# Patient Record
Sex: Female | Born: 1965 | Hispanic: No | Marital: Single | Smoking: Never smoker
Health system: Southern US, Community
[De-identification: ages and names within clinical notes are randomized; demographics above are authoritative.]

## PROBLEM LIST (undated history)

## (undated) DIAGNOSIS — F32A Depression, unspecified: Secondary | ICD-10-CM

## (undated) DIAGNOSIS — E559 Vitamin D deficiency, unspecified: Secondary | ICD-10-CM

## (undated) DIAGNOSIS — D649 Anemia, unspecified: Secondary | ICD-10-CM

## (undated) DIAGNOSIS — F329 Major depressive disorder, single episode, unspecified: Secondary | ICD-10-CM

## (undated) DIAGNOSIS — E785 Hyperlipidemia, unspecified: Secondary | ICD-10-CM

## (undated) DIAGNOSIS — IMO0001 Reserved for inherently not codable concepts without codable children: Secondary | ICD-10-CM

## (undated) DIAGNOSIS — J8410 Pulmonary fibrosis, unspecified: Secondary | ICD-10-CM

## (undated) DIAGNOSIS — J309 Allergic rhinitis, unspecified: Secondary | ICD-10-CM

## (undated) DIAGNOSIS — Z87828 Personal history of other (healed) physical injury and trauma: Secondary | ICD-10-CM

## (undated) DIAGNOSIS — J984 Other disorders of lung: Secondary | ICD-10-CM

## (undated) DIAGNOSIS — J8489 Other specified interstitial pulmonary diseases: Secondary | ICD-10-CM

## (undated) HISTORY — DX: Hyperlipidemia, unspecified: E78.5

## (undated) HISTORY — DX: Pulmonary fibrosis, unspecified: J84.10

## (undated) HISTORY — DX: Allergic rhinitis, unspecified: J30.9

## (undated) HISTORY — DX: Other disorders of lung: J98.4

## (undated) HISTORY — DX: Anemia, unspecified: D64.9

## (undated) HISTORY — DX: Vitamin D deficiency, unspecified: E55.9

## (undated) HISTORY — DX: Major depressive disorder, single episode, unspecified: F32.9

## (undated) HISTORY — DX: Depression, unspecified: F32.A

---
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
## 1991-06-25 HISTORY — PX: TUBAL LIGATION: SHX77

## 2003-06-25 DIAGNOSIS — Z87828 Personal history of other (healed) physical injury and trauma: Secondary | ICD-10-CM

## 2003-06-25 HISTORY — DX: Personal history of other (healed) physical injury and trauma: Z87.828

## 2003-06-25 HISTORY — PX: KNEE SURGERY: SHX244

## 2004-05-09 ENCOUNTER — Ambulatory Visit: Payer: Self-pay | Admitting: Unknown Physician Specialty

## 2004-06-07 ENCOUNTER — Ambulatory Visit: Payer: Self-pay | Admitting: Unknown Physician Specialty

## 2008-03-30 ENCOUNTER — Ambulatory Visit: Payer: Self-pay | Admitting: Endocrinology

## 2008-04-05 ENCOUNTER — Ambulatory Visit: Payer: Self-pay | Admitting: Endocrinology

## 2008-09-22 DIAGNOSIS — F329 Major depressive disorder, single episode, unspecified: Secondary | ICD-10-CM

## 2008-09-22 DIAGNOSIS — E7800 Pure hypercholesterolemia, unspecified: Secondary | ICD-10-CM

## 2008-09-22 DIAGNOSIS — R059 Cough, unspecified: Secondary | ICD-10-CM | POA: Insufficient documentation

## 2008-09-22 DIAGNOSIS — D649 Anemia, unspecified: Secondary | ICD-10-CM | POA: Insufficient documentation

## 2008-09-22 DIAGNOSIS — L719 Rosacea, unspecified: Secondary | ICD-10-CM | POA: Insufficient documentation

## 2008-09-22 DIAGNOSIS — E039 Hypothyroidism, unspecified: Secondary | ICD-10-CM

## 2008-09-22 DIAGNOSIS — L708 Other acne: Secondary | ICD-10-CM

## 2008-09-22 DIAGNOSIS — N39498 Other specified urinary incontinence: Secondary | ICD-10-CM

## 2008-09-22 DIAGNOSIS — K219 Gastro-esophageal reflux disease without esophagitis: Secondary | ICD-10-CM

## 2008-09-22 DIAGNOSIS — R05 Cough: Secondary | ICD-10-CM

## 2008-09-26 ENCOUNTER — Ambulatory Visit: Payer: Self-pay | Admitting: Internal Medicine

## 2008-09-26 DIAGNOSIS — J8410 Pulmonary fibrosis, unspecified: Secondary | ICD-10-CM

## 2008-09-26 DIAGNOSIS — J82 Pulmonary eosinophilia, not elsewhere classified: Secondary | ICD-10-CM

## 2008-10-03 ENCOUNTER — Encounter: Payer: Self-pay | Admitting: Internal Medicine

## 2008-10-28 ENCOUNTER — Ambulatory Visit: Payer: Self-pay | Admitting: Internal Medicine

## 2008-11-20 IMAGING — CT CT CHEST W/ CM
1 of 2 series · 14 of 32 positions shown, 18 images · non-contrast
Comparison: none

REASON FOR EXAM: Shortness of breath  Infiltrates on xray
COMMENTS:

[Series 3: lung windows · axial · 0.75mm/px · z∈[-570,-350]mm · 14 of 53 slices shown, 18 images]
[im 5/53  mediastinal]
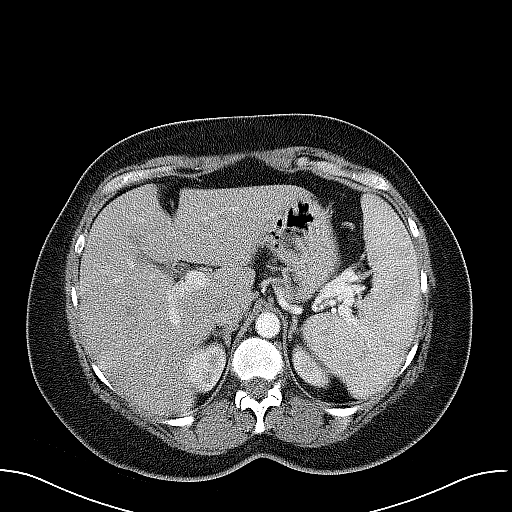
[im 5/53  lung]
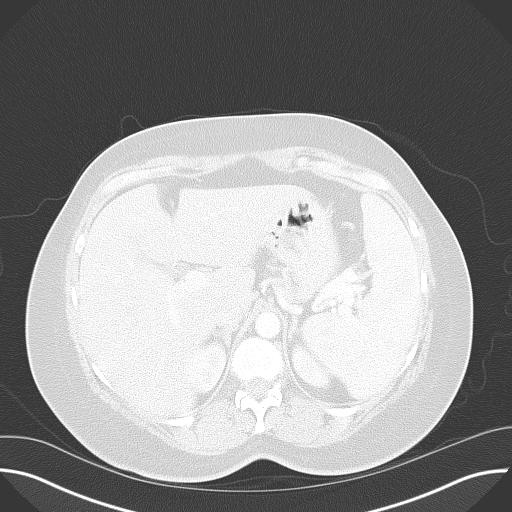
[im 9/53  lung]
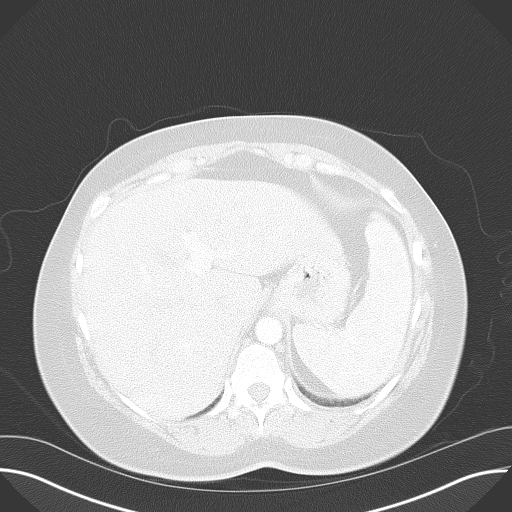
[im 13/53  lung]
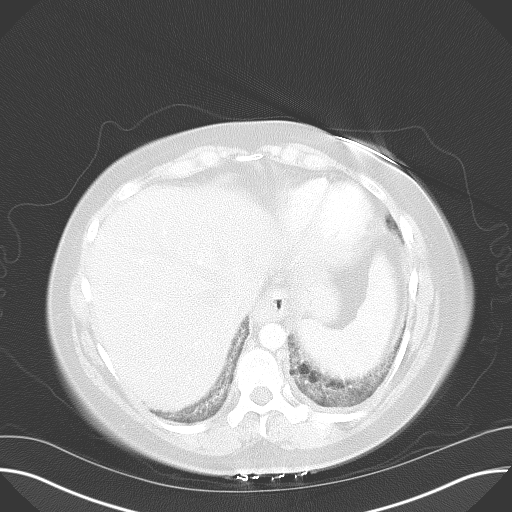
[im 17/53  lung]
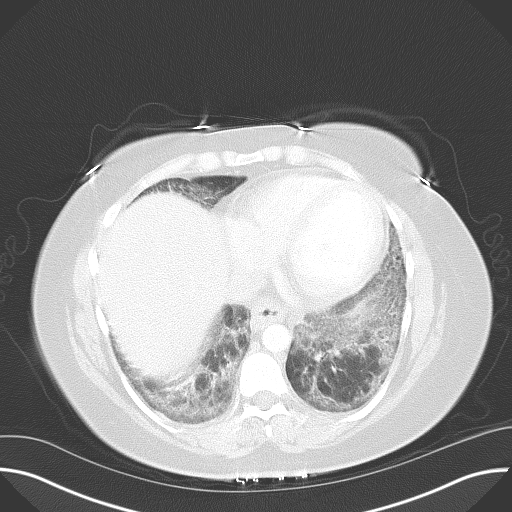
[im 21/53  mediastinal]
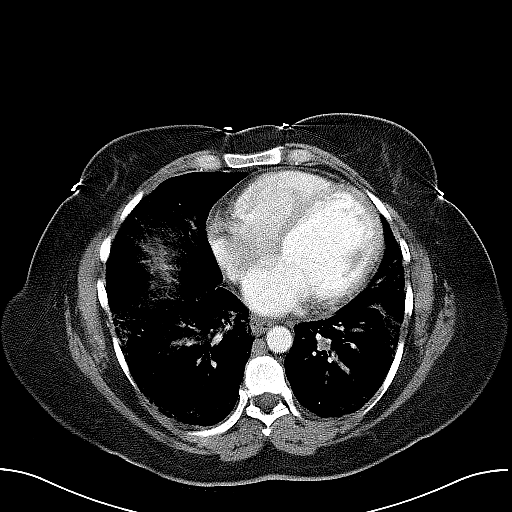
[im 21/53  lung]
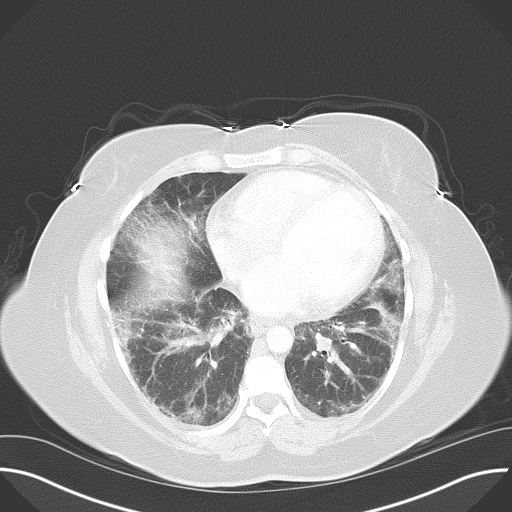
[im 24/53  lung]
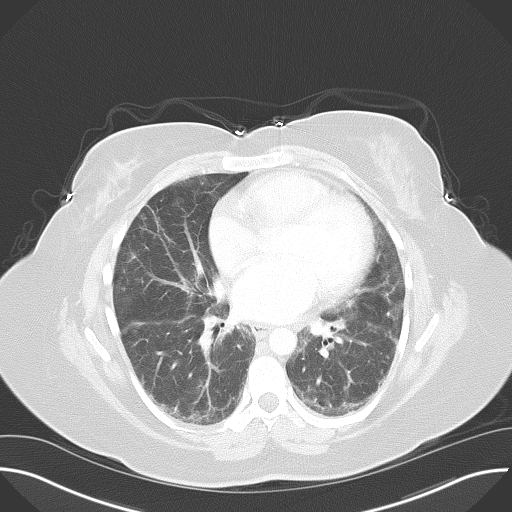
[im 25/53  lung]
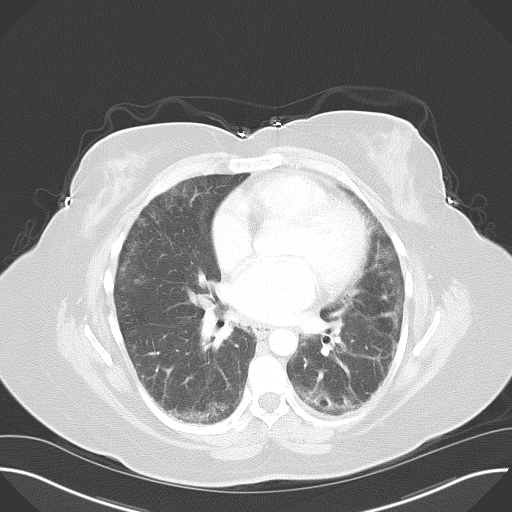
[im 27/53  lung]
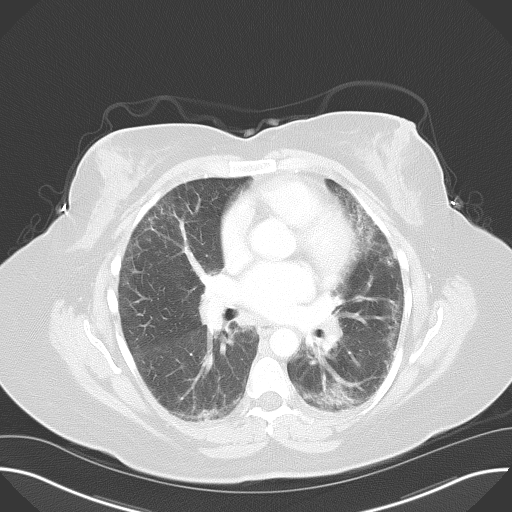
[im 29/53  mediastinal]
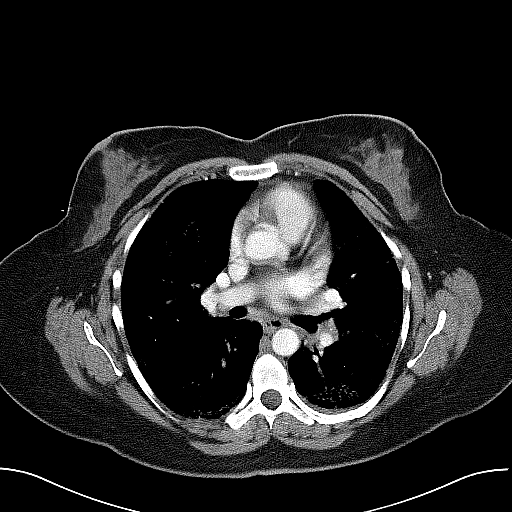
[im 29/53  lung]
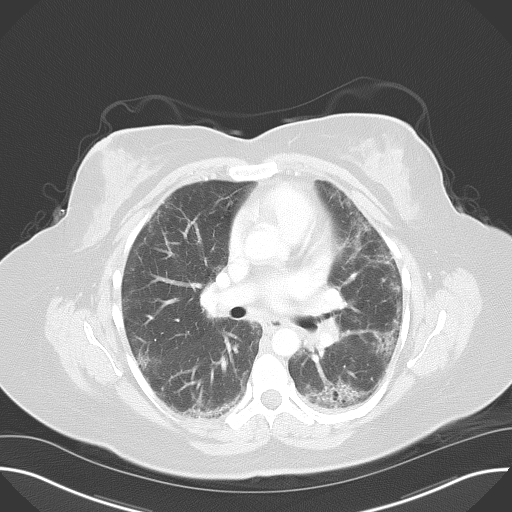
[im 33/53  lung]
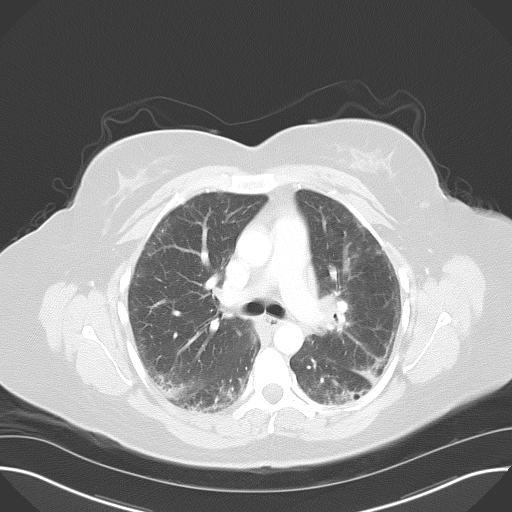
[im 37/53  lung]
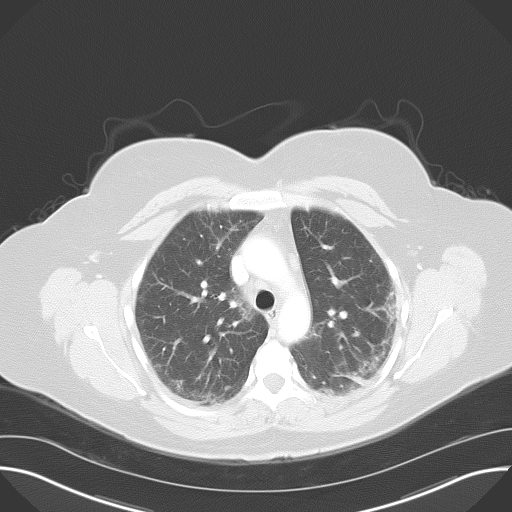
[im 41/53  lung]
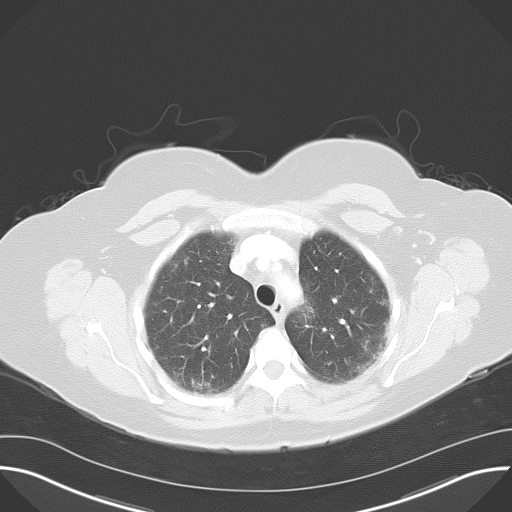
[im 45/53  mediastinal]
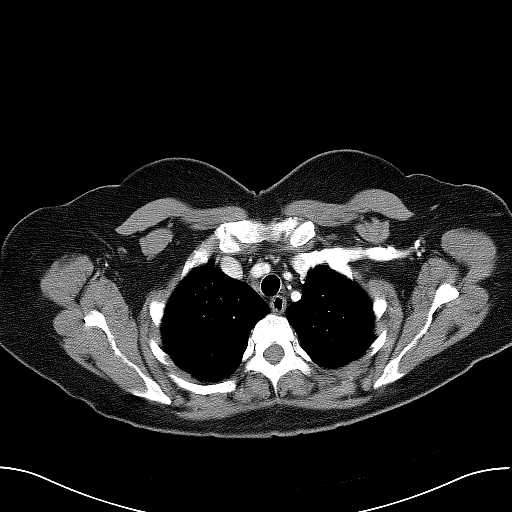
[im 45/53  lung]
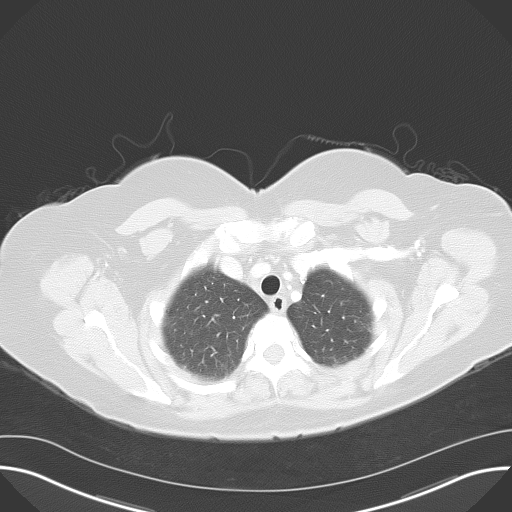
[im 49/53  lung]
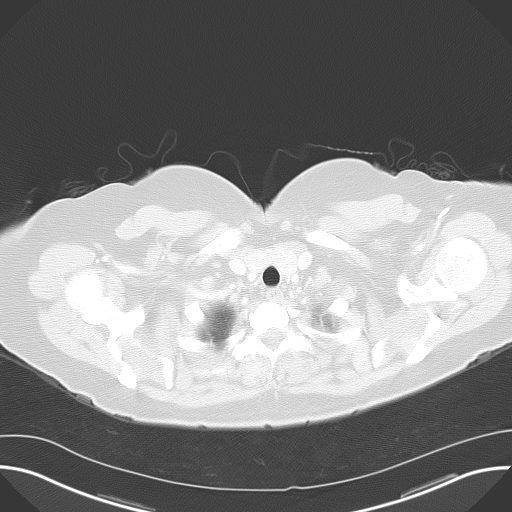

[14 of 32 positions shown; findings below may reference images not displayed]

PROCEDURE:     CT  - CT CHEST WITH CONTRAST  - April 05, 2008  [DATE]

RESULT:     Helical 5 mm sections were obtained from the thoracic inlet
through the lung bases status post intravenous administration of 70 ml of
Isovue 370.

Evaluation of the mediastinum and hilar regions and structures demonstrates
no evidence of mediastinal or hilar adenopathy nor masses. The lung
parenchyma demonstrates patchy areas of interstitial thickening along the
periphery of the lungs with greatest confluence in the lung bases. There
does appear to be an airspace component of these areas of interstitial
thickening. Mild honeycombing is appreciated within the lung bases and these
findings appear to be consistent with a component of interstitial lung
disease. A superimposed airspace infiltrate cannot be excluded. No focal
regions of consolidation are appreciated nor evidence of masses or nodules.
The visualized upper abdominal viscera demonstrate a rounded area of soft
tissue attenuation within the LEFT epigastric region. This demonstrates a
central area of fat and likely represents multiple loops of small bowel. If
there is clinical concern of abdominal abnormality this can be further
evaluated with CT of the abdomen and pelvis. The remaining visualized upper
abdominal viscera are otherwise unremarkable.
IMPRESSION: 1. CT findings which appear to represent an element of interstitial lung
disease primarily within the periphery of the lung bases. A superimposed
airspace component is also a diagnostic consideration, if clinically
appropriate. No focal regions of consolidation or masses or nodules are
identified. These findings  can be monitored with CT status post appropriate
therapeutic regimen.

2. Likely a loop of bowel within the LEFT epigastric region as described
above.

## 2008-11-23 ENCOUNTER — Encounter: Payer: Self-pay | Admitting: Internal Medicine

## 2009-06-01 ENCOUNTER — Ambulatory Visit: Payer: Self-pay | Admitting: Family Medicine

## 2009-10-11 ENCOUNTER — Ambulatory Visit: Payer: Self-pay | Admitting: Cardiology

## 2009-12-04 DIAGNOSIS — J984 Other disorders of lung: Secondary | ICD-10-CM | POA: Insufficient documentation

## 2010-01-30 ENCOUNTER — Encounter: Payer: Self-pay | Admitting: Otolaryngology

## 2011-06-25 HISTORY — PX: LUNG BIOPSY: SHX232

## 2012-01-13 ENCOUNTER — Ambulatory Visit: Payer: Self-pay | Admitting: Family Medicine

## 2012-01-15 ENCOUNTER — Ambulatory Visit: Payer: Self-pay | Admitting: Family Medicine

## 2012-08-11 ENCOUNTER — Encounter: Payer: Self-pay | Admitting: Internal Medicine

## 2012-08-22 ENCOUNTER — Encounter: Payer: Self-pay | Admitting: Internal Medicine

## 2012-08-31 IMAGING — US ULTRASOUND RIGHT BREAST
1 series · 14 of 25 positions shown · non-contrast
Comparison: none

REASON FOR EXAM: Rt Breast asymmetric density
COMMENTS:

PROCEDURE:     US  - US BREAST RIGHT  - January 15, 2012  [DATE]
RESULT:
The region of interest within the right breast is evaluated from the 9
o'clock to the 12 o'clock position. No solid or cystic sonographic
abnormalities are identified.

[Series 1: ultrasound right breast · 0.10mm/px · 14 of 29 slices shown]
[im 1/29]
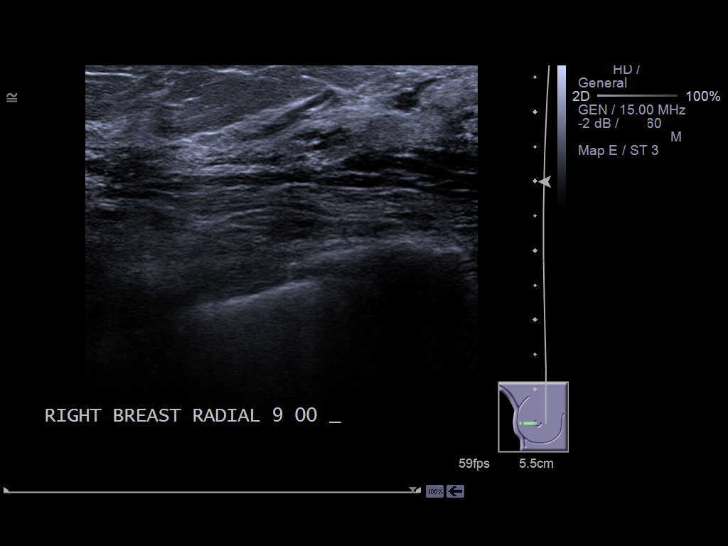
[im 3/29]
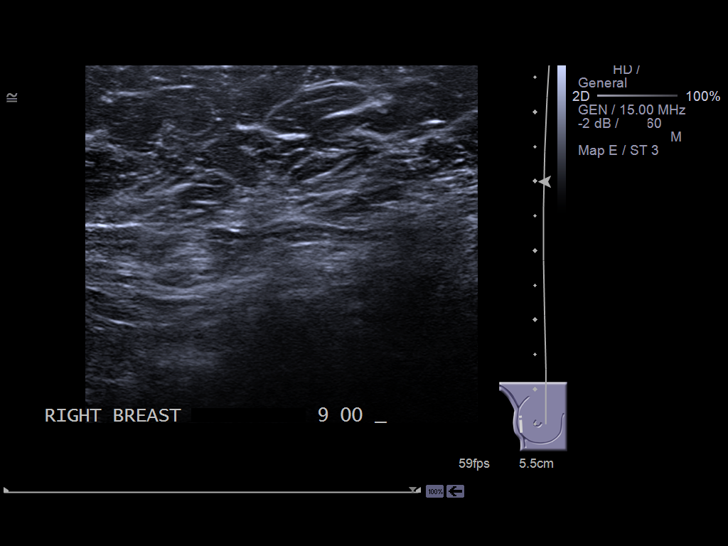
[im 5/29]
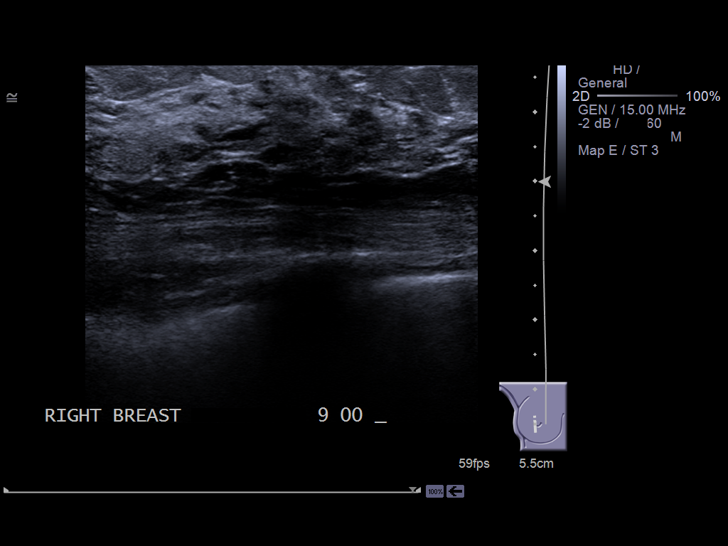
[im 8/29]
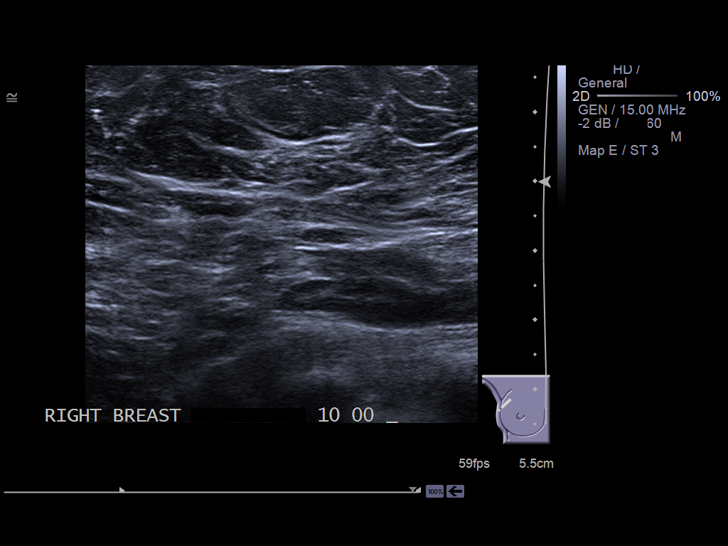
[im 10/29]
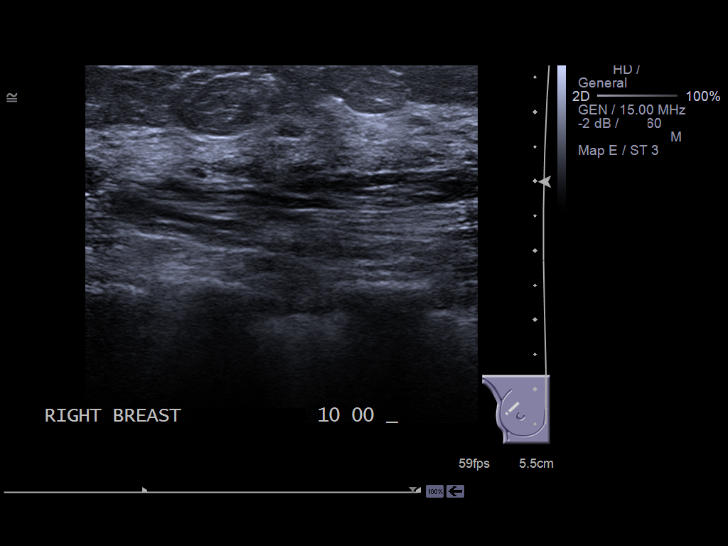
[im 11/29]
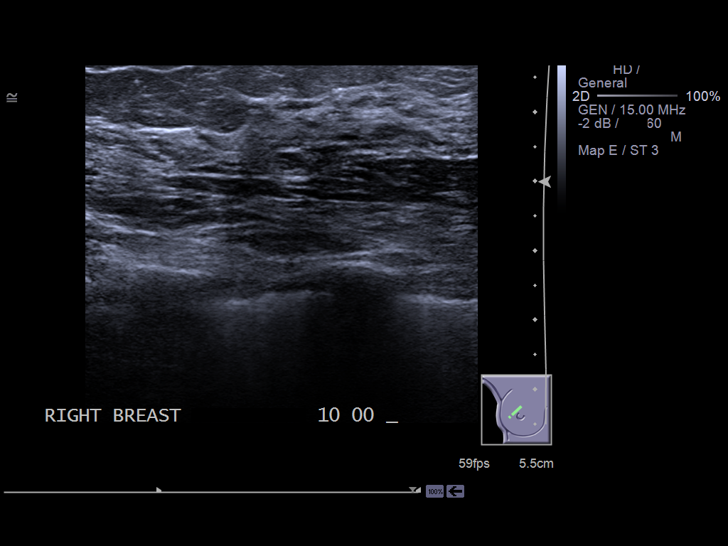
[im 13/29]
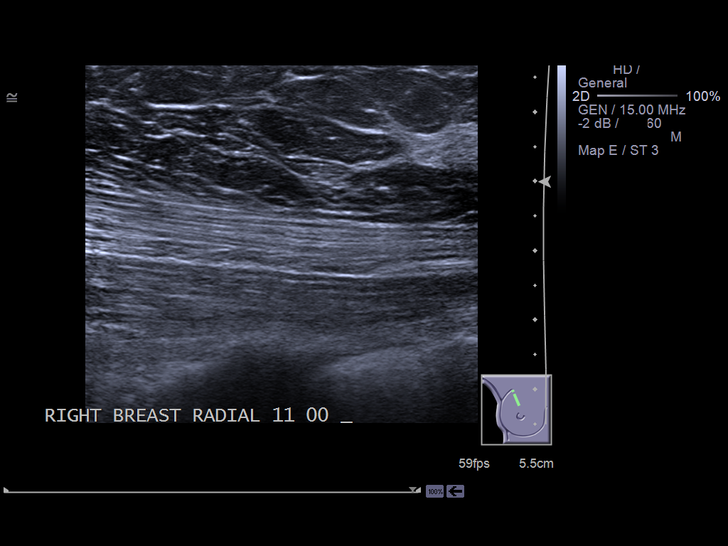
[im 16/29]
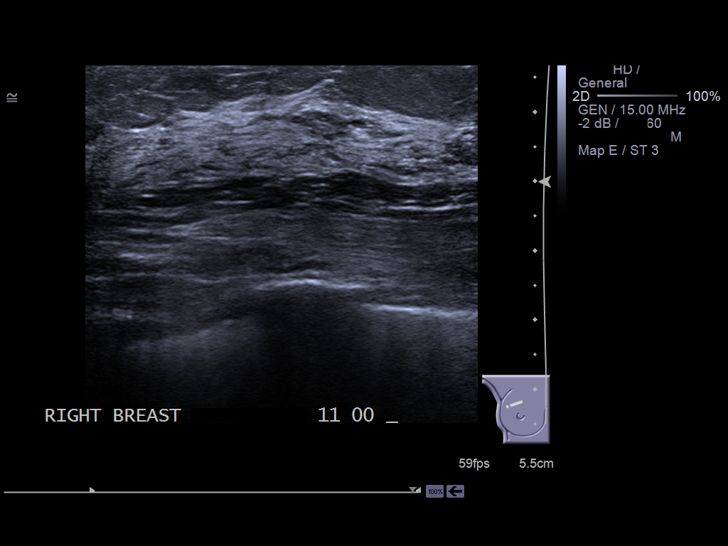
[im 18/29]
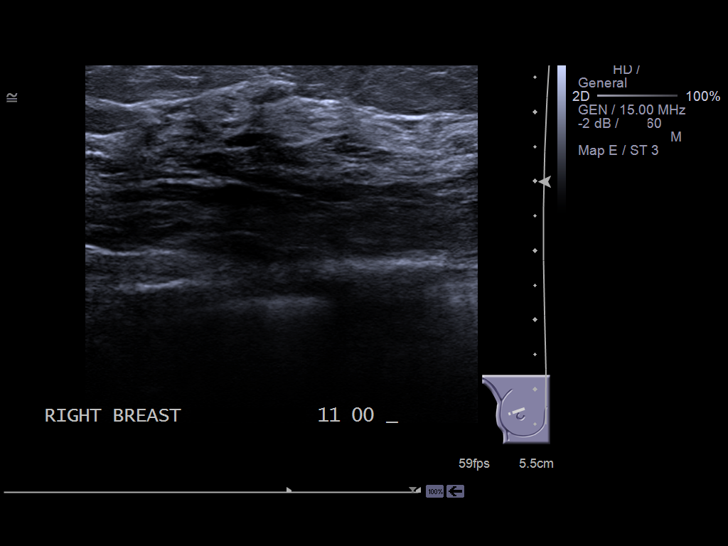
[im 19/29]
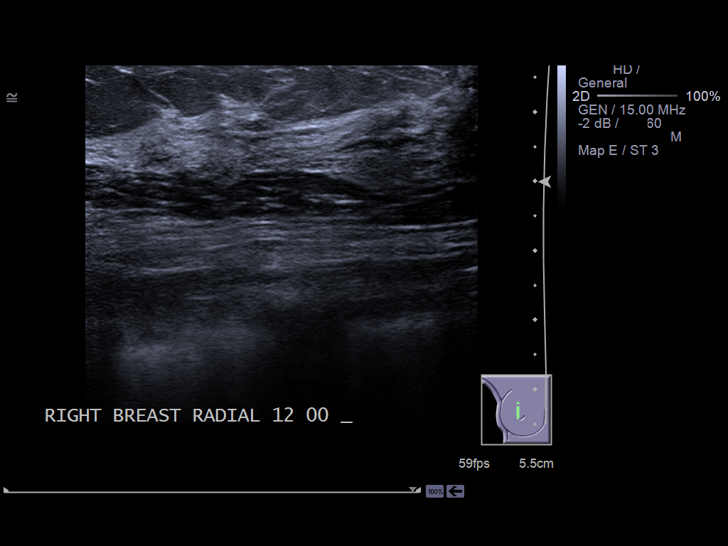
[im 22/29]
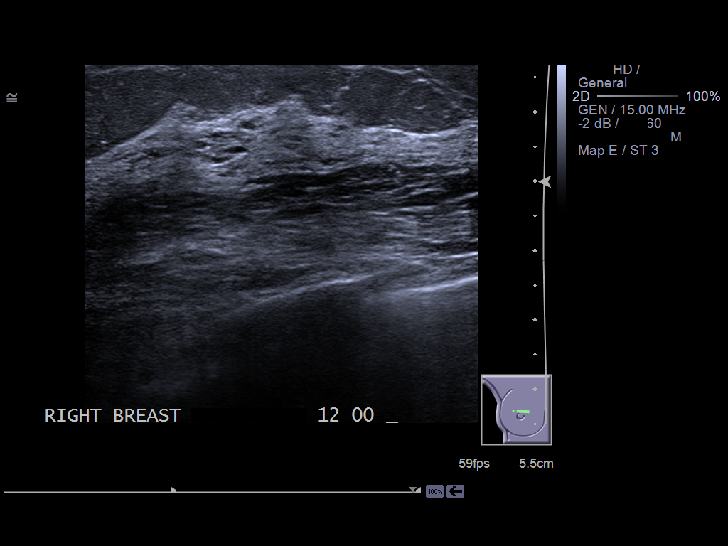
[im 24/29]
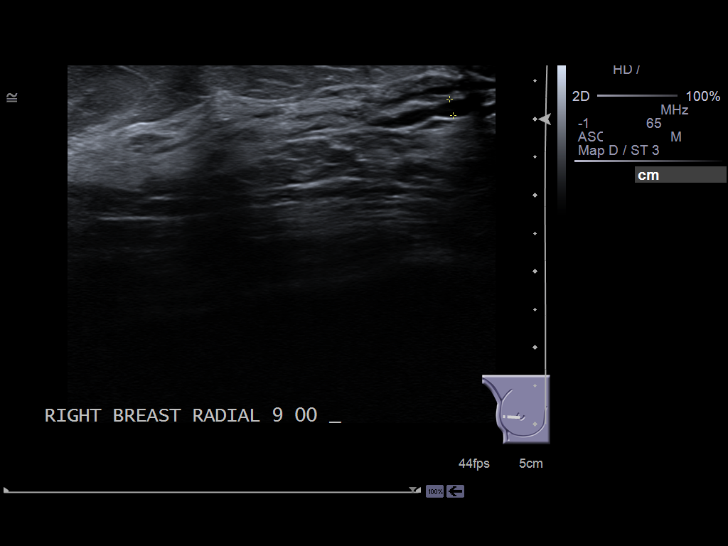
[im 26/29]
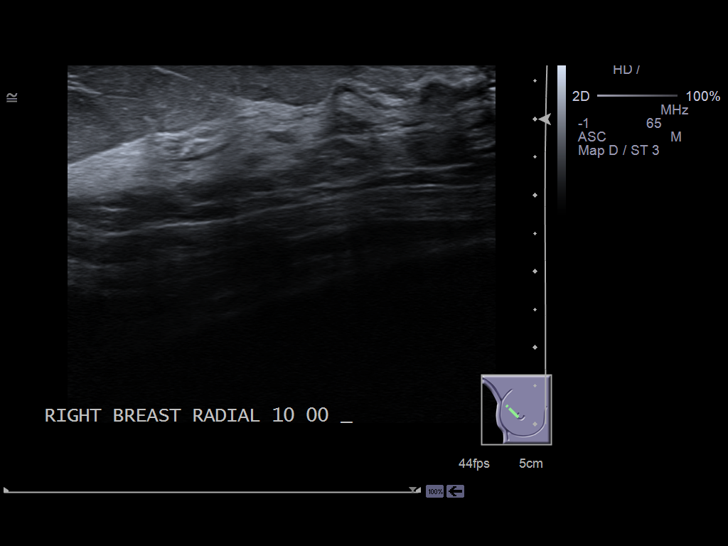
[im 29/29]
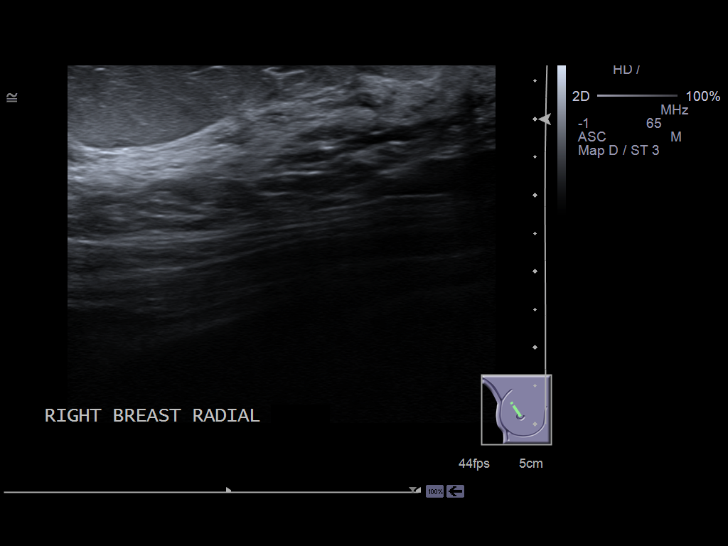

[14 of 25 positions shown; findings below may reference images not displayed]

IMPRESSION: Unremarkable Focused Right Breast Ultrasound. Please refer
to the additional radiographic view dictation for a completed discussion.

## 2012-09-22 ENCOUNTER — Encounter: Payer: Self-pay | Admitting: Internal Medicine

## 2012-10-22 ENCOUNTER — Encounter: Payer: Self-pay | Admitting: Internal Medicine

## 2012-11-22 ENCOUNTER — Encounter: Payer: Self-pay | Admitting: Internal Medicine

## 2013-02-01 DIAGNOSIS — J8489 Other specified interstitial pulmonary diseases: Secondary | ICD-10-CM | POA: Insufficient documentation

## 2014-11-01 ENCOUNTER — Other Ambulatory Visit: Payer: Self-pay

## 2014-11-01 DIAGNOSIS — Z1231 Encounter for screening mammogram for malignant neoplasm of breast: Secondary | ICD-10-CM

## 2014-11-15 ENCOUNTER — Other Ambulatory Visit: Payer: Self-pay | Admitting: Internal Medicine

## 2014-11-15 DIAGNOSIS — J84112 Idiopathic pulmonary fibrosis: Secondary | ICD-10-CM

## 2014-11-17 ENCOUNTER — Ambulatory Visit
Admission: RE | Admit: 2014-11-17 | Discharge: 2014-11-17 | Disposition: A | Discharge status: Home / Self Care | Payer: 59 | Source: Ambulatory Visit | Attending: Family Medicine | Admitting: Family Medicine

## 2014-11-17 ENCOUNTER — Other Ambulatory Visit: Payer: Self-pay | Admitting: Family Medicine

## 2014-11-17 DIAGNOSIS — Z1231 Encounter for screening mammogram for malignant neoplasm of breast: Secondary | ICD-10-CM | POA: Diagnosis not present

## 2014-12-06 ENCOUNTER — Other Ambulatory Visit: Payer: Self-pay | Admitting: Internal Medicine

## 2014-12-06 DIAGNOSIS — J84112 Idiopathic pulmonary fibrosis: Secondary | ICD-10-CM

## 2014-12-14 ENCOUNTER — Ambulatory Visit
Admission: RE | Admit: 2014-12-14 | Discharge: 2014-12-14 | Disposition: A | Discharge status: Home / Self Care | Payer: 59 | Source: Ambulatory Visit | Attending: Internal Medicine | Admitting: Internal Medicine

## 2014-12-14 DIAGNOSIS — J84112 Idiopathic pulmonary fibrosis: Secondary | ICD-10-CM | POA: Diagnosis not present

## 2014-12-14 HISTORY — DX: Other specified interstitial pulmonary diseases: J84.89

## 2014-12-14 HISTORY — DX: Reserved for inherently not codable concepts without codable children: IMO0001

## 2014-12-14 HISTORY — DX: Personal history of other (healed) physical injury and trauma: Z87.828

## 2014-12-14 MED ORDER — TECHNETIUM TC 99M SESTAMIBI - CARDIOLITE
32.7840 | Freq: Once | INTRAVENOUS | Status: AC | PRN
  Administered 2014-12-14: 32.784 via INTRAVENOUS

## 2014-12-14 MED ORDER — REGADENOSON 0.4 MG/5ML IV SOLN
0.4000 mg | Freq: Once | INTRAVENOUS | Status: AC
  Administered 2014-12-14: 0.4 mg via INTRAVENOUS

## 2014-12-14 MED ORDER — TECHNETIUM TC 99M SESTAMIBI - CARDIOLITE
13.4310 | Freq: Once | INTRAVENOUS | Status: AC | PRN
  Administered 2014-12-14: 13.431 via INTRAVENOUS

## 2014-12-14 NOTE — Progress Notes (Signed)
*  PRELIMINARY RESULTS* Echocardiogram 2D Echocardiogram has been performed.  Garrel Ridgel Stills 12/14/2014, 11:53 AM

## 2014-12-20 LAB — NM MYOCAR MULTI W/SPECT W/WALL MOTION / EF
Estimated workload: 1 METS
Exercise duration (min): 1 min
Exercise duration (sec): 0 s
LV dias vol: 215 mL
LV sys vol: 108 mL
Peak HR: 75 {beats}/min
Rest HR: 75 {beats}/min
SDS: 3
SRS: 23
SSS: 13
TID: 0.91

## 2015-02-03 DIAGNOSIS — R790 Abnormal level of blood mineral: Secondary | ICD-10-CM | POA: Insufficient documentation

## 2015-02-03 DIAGNOSIS — E559 Vitamin D deficiency, unspecified: Secondary | ICD-10-CM | POA: Insufficient documentation

## 2015-02-06 ENCOUNTER — Ambulatory Visit (INDEPENDENT_AMBULATORY_CARE_PROVIDER_SITE_OTHER): Payer: 59 | Admitting: Family Medicine

## 2015-02-06 ENCOUNTER — Encounter: Payer: Self-pay | Admitting: Family Medicine

## 2015-02-06 VITALS — BP 110/66 | HR 84 | Temp 97.9°F | Resp 18 | Ht 62.0 in | Wt 212.0 lb

## 2015-02-06 DIAGNOSIS — T148 Other injury of unspecified body region: Secondary | ICD-10-CM | POA: Diagnosis not present

## 2015-02-06 DIAGNOSIS — W57XXXA Bitten or stung by nonvenomous insect and other nonvenomous arthropods, initial encounter: Secondary | ICD-10-CM | POA: Diagnosis not present

## 2015-02-06 MED ORDER — FLUOCINONIDE 0.05 % EX CREA: 1.0000 "application " | TOPICAL_CREAM | Freq: Three times a day (TID) | CUTANEOUS | Status: DC

## 2015-02-06 NOTE — Progress Notes (Signed)
Subjective:     Patient ID: Shelby Graham, female   DOB: 07-25-1965, 49 y.o.   MRN: [id number]  HPI  Chief Complaint  Patient presents with  . Rash    Ptient comes in office today to address rash that is present on her left forearm. Patient states that rash has come and gone over the past month and describes it as very itchy, patient denies any spreading of rash to other parts of her body.   States she has no pets or other family members with the rash. Does spend time outside. Has tried otc hydrocortisone cream and oral Benadryl with minimal improvement. Reports she remains on cellcept for her pulmonary condition.   Review of Systems  Skin:       Hx of rosacea but no eczema or atopy.       Objective:   Physical Exam  Constitutional: She appears well-developed and well-nourished. No distress.  Skin:  A few scattered slightly raised papules on her left anterior forearm. No pustules, vesicles, drainage or crusting.        Assessment:    1. Insect bites: Suspect she is in an itch-scratch cycle keeping the rash active. - fluocinonide cream (LIDEX) 0.05 %; Apply 1 application topically 3 (three) times daily. To insect bites  Dispense: 15 g; Refill: 0    Plan:    Cool compresses for symptomatic relief.

## 2015-02-06 NOTE — Patient Instructions (Signed)
Discussed use of cold compresses to ease itching.

## 2015-08-01 ENCOUNTER — Encounter: Payer: Self-pay | Admitting: Physician Assistant

## 2015-08-01 ENCOUNTER — Ambulatory Visit (INDEPENDENT_AMBULATORY_CARE_PROVIDER_SITE_OTHER): Payer: 59 | Admitting: Physician Assistant

## 2015-08-01 VITALS — BP 112/70 | HR 69 | Temp 98.3°F | Resp 16 | Wt 200.6 lb

## 2015-08-01 DIAGNOSIS — R509 Fever, unspecified: Secondary | ICD-10-CM

## 2015-08-01 DIAGNOSIS — R52 Pain, unspecified: Secondary | ICD-10-CM

## 2015-08-01 DIAGNOSIS — R110 Nausea: Secondary | ICD-10-CM | POA: Diagnosis not present

## 2015-08-01 DIAGNOSIS — K529 Noninfective gastroenteritis and colitis, unspecified: Secondary | ICD-10-CM | POA: Diagnosis not present

## 2015-08-01 DIAGNOSIS — R21 Rash and other nonspecific skin eruption: Secondary | ICD-10-CM

## 2015-08-01 MED ORDER — ONDANSETRON HCL 4 MG PO TABS: 4.0000 mg | ORAL_TABLET | Freq: Three times a day (TID) | ORAL | Status: AC | PRN

## 2015-08-01 MED ORDER — CIPROFLOXACIN HCL 500 MG PO TABS: 500.0000 mg | ORAL_TABLET | Freq: Two times a day (BID) | ORAL | Status: DC

## 2015-08-01 NOTE — Progress Notes (Signed)
Patient: Shelby Graham Female    DOB: [birth year]   50 y.o.   MRN: [id number] Visit Date: 08/01/2015  Today's Provider: Margaretann Loveless, PA-C   Chief Complaint  Patient presents with  . Fever   Subjective:    Fever  This is a new problem. The current episode started yesterday (Started Sunday night at 6:00 pm). The problem occurs intermittently. The maximum temperature noted was 101 to 101.9 F (Fever started Monday morning). The temperature was taken using an oral thermometer. Associated symptoms include abdominal pain, congestion, diarrhea, headaches, muscle aches, nausea, a rash (Patient has been treated for this in the past with Fluocinonide cream and the rash got better but now is back. Patient states it is not related with what is going on now.), a sore throat (from the drainage) and vomiting. Pertinent negatives include no chest pain, coughing, ear pain or wheezing. She has tried fluids for the symptoms. The treatment provided no relief.       Allergies  Allergen Reactions  . Percocet [Oxycodone-Acetaminophen] Hives and Itching   Previous Medications   ERGOCALCIFEROL (DRISDOL) 50000 UNITS CAPSULE    Take by mouth.   FERROUS SULFATE 325 (65 FE) MG TABLET    Take by mouth. Reported on 08/01/2015   FLUOCINONIDE CREAM (LIDEX) 0.05 %    Apply 1 application topically 3 (three) times daily. To insect bites   MYCOPHENOLATE (CELLCEPT) 500 MG TABLET    Take by mouth.   OMEPRAZOLE (PRILOSEC) 40 MG CAPSULE    Take by mouth.   RANITIDINE (ZANTAC) 150 MG CAPSULE    Take by mouth.    Review of Systems  Constitutional: Positive for fever and fatigue. Negative for chills.  HENT: Positive for congestion, postnasal drip, rhinorrhea, sinus pressure, sneezing (monday morning) and sore throat (from the drainage). Negative for ear pain, trouble swallowing and voice change.   Respiratory: Negative for cough, chest tightness, shortness of breath and wheezing.   Cardiovascular: Negative for  chest pain.  Gastrointestinal: Positive for nausea, vomiting, abdominal pain and diarrhea.  Musculoskeletal: Positive for arthralgias.  Skin: Positive for rash (Patient has been treated for this in the past with Fluocinonide cream and the rash got better but now is back. Patient states it is not related with what is going on now.).  Neurological: Positive for headaches. Negative for dizziness.    Social History  Substance Use Topics  . Smoking status: Never Smoker   . Smokeless tobacco: Not on file  . Alcohol Use: No   Objective:   BP 112/70 mmHg  Pulse 69  Temp(Src) 98.3 F (36.8 C) (Oral)  Resp 16  Wt 200 lb 9.6 oz (90.992 kg)  SpO2 97%  Physical Exam  Constitutional: She appears well-developed and well-nourished. No distress.  HENT:  Head: Normocephalic and atraumatic.  Right Ear: Hearing, tympanic membrane, external ear and ear canal normal.  Left Ear: Hearing, tympanic membrane, external ear and ear canal normal.  Nose: Mucosal edema and rhinorrhea present. Right sinus exhibits no maxillary sinus tenderness and no frontal sinus tenderness. Left sinus exhibits no maxillary sinus tenderness and no frontal sinus tenderness.  Mouth/Throat: Uvula is midline, oropharynx is clear and moist and mucous membranes are normal. No oropharyngeal exudate, posterior oropharyngeal edema or posterior oropharyngeal erythema.  Eyes: Conjunctivae are normal. Pupils are equal, round, and reactive to light. Right eye exhibits no discharge. Left eye exhibits no discharge. No scleral icterus.  Neck: Normal range of motion. Neck  supple. No tracheal deviation present. No thyromegaly present.  Cardiovascular: Normal rate, regular rhythm and normal heart sounds.  Exam reveals no gallop and no friction rub.   No murmur heard. Pulmonary/Chest: Effort normal and breath sounds normal. No stridor. No respiratory distress. She has no wheezes. She has no rales.  Abdominal: Soft. Bowel sounds are normal. She  exhibits no distension and no mass. There is no tenderness. There is no rebound and no guarding.  Lymphadenopathy:    She has no cervical adenopathy.  Skin: Skin is warm and dry. Rash noted. She is not diaphoretic.     Vitals reviewed.       Assessment & Plan:     1. Fever, unspecified fever cause Flu test was negative today. She has had her flu vaccine through her employer. She states that the symptoms that are bothering her the most are her upset stomach, nausea and diarrhea. - POC Influenza A&B (Binax test)  2. Body aches See above medical treatment plan. - POC Influenza A&B (Binax test)  3. Rash Has a recurrent rash on the left arm just distal to the antecubital fossa. It has previously responded to steroid creams. It does not itch, cause pain, or have any discharge. No history of bug bites. No pets in the home. We'll refer to dermatology for further evaluation. Advised to use Benadryl to see if this may decrease the area of irritation. I do question if it may be a chronic her Cartia secondary to stress and anxiety. She does state she has a lot of stress on her at this time. - Ambulatory referral to Dermatology  4. Gastroenteritis We'll give Cipro as below for gastroenteritis. Advised that this may likely be viral especially since she was recently at a get-together where there was buffet food. She has tried Imodium with some relief. I will also give Zofran for symptomatic relief of nausea. I did advise the importance of making sure to stay well-hydrated and trying to eat during this so that she does not develop any electrolyte imbalances. She is to call the office if symptoms fail to improve or worsen. - ciprofloxacin (CIPRO) 500 MG tablet; Take 1 tablet (500 mg total) by mouth 2 (two) times daily.  Dispense: 14 tablet; Refill: 0  5. Nausea See above medical treatment plan. - ondansetron (ZOFRAN) 4 MG tablet; Take 1 tablet (4 mg total) by mouth every 8 (eight) hours as needed for  nausea or vomiting.  Dispense: 20 tablet; Refill: 0       Margaretann Loveless, PA-C  [facility]

## 2015-08-01 NOTE — Patient Instructions (Signed)

## 2015-12-04 ENCOUNTER — Other Ambulatory Visit: Payer: Self-pay | Admitting: Family Medicine

## 2015-12-05 ENCOUNTER — Ambulatory Visit (INDEPENDENT_AMBULATORY_CARE_PROVIDER_SITE_OTHER): Payer: 59 | Admitting: Physician Assistant

## 2015-12-05 ENCOUNTER — Encounter: Payer: Self-pay | Admitting: Physician Assistant

## 2015-12-05 VITALS — BP 128/78 | HR 58 | Temp 98.0°F | Resp 17 | Ht 62.0 in | Wt 193.4 lb

## 2015-12-05 DIAGNOSIS — Z713 Dietary counseling and surveillance: Secondary | ICD-10-CM

## 2015-12-05 DIAGNOSIS — E559 Vitamin D deficiency, unspecified: Secondary | ICD-10-CM

## 2015-12-05 DIAGNOSIS — Z1159 Encounter for screening for other viral diseases: Secondary | ICD-10-CM

## 2015-12-05 DIAGNOSIS — Z6835 Body mass index (BMI) 35.0-35.9, adult: Secondary | ICD-10-CM

## 2015-12-05 DIAGNOSIS — Z1239 Encounter for other screening for malignant neoplasm of breast: Secondary | ICD-10-CM

## 2015-12-05 DIAGNOSIS — E039 Hypothyroidism, unspecified: Secondary | ICD-10-CM

## 2015-12-05 DIAGNOSIS — R87612 Low grade squamous intraepithelial lesion on cytologic smear of cervix (LGSIL): Secondary | ICD-10-CM

## 2015-12-05 DIAGNOSIS — E782 Mixed hyperlipidemia: Secondary | ICD-10-CM

## 2015-12-05 DIAGNOSIS — Z0000 Encounter for general adult medical examination without abnormal findings: Secondary | ICD-10-CM

## 2015-12-05 DIAGNOSIS — J8489 Other specified interstitial pulmonary diseases: Secondary | ICD-10-CM

## 2015-12-05 DIAGNOSIS — R87810 Cervical high risk human papillomavirus (HPV) DNA test positive: Secondary | ICD-10-CM

## 2015-12-05 DIAGNOSIS — Z124 Encounter for screening for malignant neoplasm of cervix: Secondary | ICD-10-CM

## 2015-12-05 DIAGNOSIS — D649 Anemia, unspecified: Secondary | ICD-10-CM

## 2015-12-05 MED ORDER — NALTREXONE-BUPROPION HCL ER 8-90 MG PO TB12: ORAL_TABLET | ORAL | Status: DC

## 2015-12-05 NOTE — Progress Notes (Signed)
Patient: Shelby Graham, Female    DOB: [birth year], 50 y.o.   MRN: [id number] Visit Date: 12/05/2015  Today's Provider: Margaretann Loveless, PA-C   Chief Complaint  Patient presents with  . Annual Exam   Subjective:    Annual physical exam [place] Pinson is a 50 y.o. female who presents today for health maintenance and complete physical. She feels well. She reports exercising walks minimal and does the stationary bike 3 times a week. She reports she is sleeping fairly well.   She reports that she is going to the organs transplant doctor for her lungs on December 13, 2015 at [facility]. This will be her first visit with them. She has idiopathic pulmonary fibrosis and interstitial pneumonitis. She is currently on 5L continuous oxygen.    She also wants to talk about her weight, and talk about Contrave. She reports that she has change diet eating veggies and fruits. She tries to exercise as much as her lungs will allow her to. She is needing to try to lose weight for possible transplant surgery. She has been trying to lose on her own and states she will fluctuate between 189-193 pounds.   She reports that she had her Prevnar 13 and Tdap 04/2015. She does have questions of some other vaccines including pneumococcal 23, zoster and hep B. These would be needed prior to transplant surgery.  Mammogram:11/17/14 Normal Pap: Normal 07/14/13 -----------------------------------------------------------------   Review of Systems  Constitutional: Negative.   HENT: Positive for rhinorrhea.   Eyes: Negative.   Respiratory: Positive for cough and shortness of breath.   Cardiovascular: Negative.   Gastrointestinal: Negative.   Endocrine: Negative.   Genitourinary: Negative.   Musculoskeletal: Negative.   Skin: Negative.   Allergic/Immunologic: Positive for environmental allergies.  Neurological: Negative.   Hematological: Negative.   Psychiatric/Behavioral: Negative.     Social History  She  reports that she has never smoked. She does not have any smokeless tobacco history on file. She reports that she does not drink alcohol.       Social History   Social History  . Marital Status: Single    Spouse Name: N/A  . Number of Children: N/A  . Years of Education: N/A   Social History Main Topics  . Smoking status: Never Smoker   . Smokeless tobacco: None  . Alcohol Use: No  . Drug Use: None  . Sexual Activity: Not Asked   Other Topics Concern  . None   Social History Narrative    Past Medical History  Diagnosis Date  . Shortness of breath dyspnea   . NSIP (nonspecific interstitial pneumonia) (HCC)   . History of torn meniscus of left knee 2005    surgical repair of meniscus  . Anemia   . Vitamin D deficiency   . Depression   . Hyperlipidemia   . Lung disease   . Postinflammatory pulmonary fibrosis (HCC)   . Allergic rhinitis      Patient Active Problem List   Diagnosis Date Noted  . Abnormal blood level of iron 02/03/2015  . Avitaminosis D 02/03/2015  . Nonspecific interstitial pneumonia (HCC) 02/01/2013  . Nonspecific interstitial pneumonitis (HCC) 02/01/2013  . Benign paroxysmal positional nystagmus 01/15/2010  . Disease of lung 12/04/2009  . Chronic infection of sinus 06/15/2009  . Combined fat and carbohydrate induced hyperlipemia 03/06/2009  . Allergic rhinitis 11/25/2008  . Adiposity 11/25/2008  . PULMONARY FIBROSIS ILD POST INFLAMMATORY CHRONIC 09/26/2008  . PULMONARY  INFILTRATE INCLUDES (EOSINOPHILIA) 09/26/2008  . HYPOTHYROIDISM 09/22/2008  . HYPERCHOLESTEROLEMIA 09/22/2008  . ANEMIA 09/22/2008  . DEPRESSION 09/22/2008  . GERD 09/22/2008  . Rosacea 09/22/2008  . ACNE VULGARIS 09/22/2008  . COUGH, CHRONIC 09/22/2008  . STRESS INCONTINENCE 09/22/2008  . Female genuine stress incontinence 08/26/2008    Past Surgical History  Procedure Laterality Date  . Tonsillectomy  1972  . Tubal ligation  1993  . Lung biopsy Left 2013     performed in [facility]  . Knee surgery Left 2005    Family History        Family Status  Relation Status Death Age  . Mother Alive   . Father Deceased 58    gun shot wound  . Sister Alive   . Sister Alive         Her family history includes Emphysema in her mother; Osteoporosis in her mother.    Allergies  Allergen Reactions  . Percocet [Oxycodone-Acetaminophen] Hives and Itching    Current Meds  Medication Sig  . ciprofloxacin (CIPRO) 500 MG tablet Take 1 tablet (500 mg total) by mouth 2 (two) times daily. (Patient taking differently: Take 500 mg by mouth 2 (two) times daily. )  . ergocalciferol (DRISDOL) 50000 UNITS capsule Take by mouth.  . esomeprazole (NEXIUM) 40 MG capsule   . mycophenolate (CELLCEPT) 500 MG tablet Take by mouth.   . ranitidine (ZANTAC) 150 MG capsule Take by mouth.  . sertraline (ZOLOFT) 50 MG tablet Take 50 mg by mouth.  . zolpidem (AMBIEN) 5 MG tablet Take 5 mg by mouth.  . [DISCONTINUED] omeprazole (PRILOSEC) 40 MG capsule Take by mouth.    Patient Care Team: Lorie Phenix, MD as PCP - General (Family Medicine)     Objective:   Vitals: BP 128/78 mmHg  Pulse 58  Temp(Src) 98 F (36.7 C) (Oral)  Resp 17  Ht 5\' 2"  (1.575 m)  Wt 193 lb 6.4 oz (87.726 kg)  BMI 35.36 kg/m2  SpO2 100%   Physical Exam  Constitutional: She is oriented to person, place, and time. She appears well-developed and well-nourished. No distress.  HENT:  Head: Normocephalic and atraumatic.  Right Ear: Hearing, tympanic membrane, external ear and ear canal normal.  Left Ear: Hearing, tympanic membrane, external ear and ear canal normal.  Nose: Nose normal.  Mouth/Throat: Uvula is midline, oropharynx is clear and moist and mucous membranes are normal. No oropharyngeal exudate, posterior oropharyngeal edema or posterior oropharyngeal erythema.  Eyes: Conjunctivae and EOM are normal. Pupils are equal, round, and reactive to light. Right eye exhibits no discharge. Left  eye exhibits no discharge. No scleral icterus.  Neck: Normal range of motion. Neck supple. No JVD present. Carotid bruit is not present. No tracheal deviation present. No thyromegaly present.  Cardiovascular: Normal rate, regular rhythm, normal heart sounds and intact distal pulses.  Exam reveals no gallop and no friction rub.   No murmur heard. Pulmonary/Chest: Effort normal. No respiratory distress. She has decreased breath sounds. She has no wheezes. She has no rales. She exhibits no tenderness. Right breast exhibits no inverted nipple, no mass, no nipple discharge, no skin change and no tenderness. Left breast exhibits no inverted nipple, no mass, no nipple discharge, no skin change and no tenderness. Breasts are symmetrical.  Abdominal: Soft. Bowel sounds are normal. She exhibits no distension and no mass. There is no tenderness. There is no rebound and no guarding. Hernia confirmed negative in the right inguinal area and confirmed negative in  the left inguinal area.  Genitourinary: Rectum normal, vagina normal and uterus normal. No breast swelling, tenderness, discharge or bleeding. Pelvic exam was performed with patient supine. There is no rash, tenderness, lesion or injury on the right labia. There is no rash, tenderness, lesion or injury on the left labia. Cervix exhibits no motion tenderness, no discharge and no friability. Right adnexum displays no mass, no tenderness and no fullness. Left adnexum displays no mass, no tenderness and no fullness. No erythema, tenderness or bleeding in the vagina. No signs of injury around the vagina. No vaginal discharge found.  Musculoskeletal: Normal range of motion. She exhibits no edema or tenderness.  Lymphadenopathy:    She has no cervical adenopathy.       Right: No inguinal adenopathy present.       Left: No inguinal adenopathy present.  Neurological: She is alert and oriented to person, place, and time. She has normal reflexes. No cranial nerve  deficit. Coordination normal.  Skin: Skin is warm and dry. No rash noted. She is not diaphoretic.  Psychiatric: She has a normal mood and affect. Her behavior is normal. Judgment and thought content normal.  Vitals reviewed.    Depression Screen No flowsheet data found.    Assessment & Plan:     Routine Health Maintenance and Physical Exam  Exercise Activities and Dietary recommendations Goals    None      Immunization History  Administered Date(s) Administered  . Pneumococcal Polysaccharide-23 05/10/2009  . Tdap 05/10/2009    Health Maintenance  Topic Date Due  . HIV Screening  10/27/1981  . PAP SMEAR  10/28/1987  . INFLUENZA VACCINE  01/23/2016  . TETANUS/TDAP  05/11/2019      Discussed health benefits of physical activity, and encouraged her to engage in regular exercise appropriate for her age and condition.   1. Annual physical exam Normal physical exam today. Will check labs as below and f/u pending lab results. If labs are stable and WNL she will not need to have these rechecked for one year at her next annual physical exam. She is to call the office in the meantime if she has any acute issue, questions or concerns. - CBC with Differential/Platelet - Comprehensive metabolic panel - TSH  2. Breast cancer screening Breast exam today was normal. There is no family history of breast cancer. She does perform regular self breast exams. Mammogram was ordered as below. [facility] was given to patient so she may schedule her mammogram at her convenience. - MM DIGITAL SCREENING BILATERAL; Future  3. Cervical cancer screening Pap collected today. Will send as below and f/u pending results. - Pap IG and HPV (high risk) DNA detection  4. Hypothyroidism, unspecified hypothyroidism type H/O this. Will check TSH and f/u pending results.  5. Combined fat and carbohydrate induced hyperlipemia H/O this. Diet controlled. Will check labs and f/u  pending lab results. - Lipid panel  6. Avitaminosis D Will check labs and f/u pending results. - Vitamin D (25 hydroxy)  7. Nonspecific interstitial pneumonitis [facility](HCC) She is followed by pulmonology and will be seeing [facility] transplant on December 13, 2015 for her initial consultation. She is to call following appt if she needs us to get any information or do any testing for her. - azithromycin (ZITHROMAX) 250 MG tablet; Take 1 tablet (250 mg total) by mouth daily.  Dispense: 30 tablet; Refill: 0  8. ANEMIA H/O this. Will recheck labs and f/u pending results, especially since  she is considering transplant. - CBC with Differential/Platelet  9. Need for hepatitis B screening test Will check titer for immunity as she does not know if she had this vaccination as a child. WIll f/u pending results. Also advised her to discuss needs for other vaccinations with transplant group for zoster and pneumovax. I am not sure if her insurance will cover these, but she is an exception due to her chronic illness. She would normally not get the zostavax until she was at least 51 (next year) and pneumovax is not due until Nov 2017 since she had Prevnar 13 in 04/2015. Will defer to [facility] transplant for further guidance on which to give. She may return to have these done here at this office if needed. - Hepatitis B surface antibody  10. Encounter for weight loss counseling Will add contrave as below for appetite suppression to go along with her exercising and healthier eating habits. She is to call if she does not tolerate this medication or if she has any adverse reactions. Hopefully we can get her to an appropriate BMI for transplant. - Naltrexone-Bupropion HCl ER 8-90 MG TB12; Take 1 tab PO daily x 1 week, 1 tab PO BID x 1 week, then 2 tabs PO q am and 1 tab PO q pm x 1 week, then 2 tabs PO BID.  Dispense: 120 tablet; Refill: 0  11. BMI 35.0-35.9,adult See above medical treatment plan. - Naltrexone-Bupropion HCl ER 8-90  MG TB12; Take 1 tab PO daily x 1 week, 1 tab PO BID x 1 week, then 2 tabs PO q am and 1 tab PO q pm x 1 week, then 2 tabs PO BID.  Dispense: 120 tablet; Refill: 0  --------------------------------------------------------------------    Margaretann Loveless, PA-C  [facility]

## 2015-12-05 NOTE — Patient Instructions (Signed)
Health Maintenance, Female Adopting a healthy lifestyle and getting preventive care can go a long way to promote health and wellness. Talk with your health care provider about what schedule of regular examinations is right for you. This is a good chance for you to check in with your provider about disease prevention and staying healthy. In between checkups, there are plenty of things you can do on your own. Experts have done a lot of research about which lifestyle changes and preventive measures are most likely to keep you healthy. Ask your health care provider for more information. WEIGHT AND DIET  Eat a healthy diet  Be sure to include plenty of vegetables, fruits, low-fat dairy products, and lean protein.  Do not eat a lot of foods high in solid fats, added sugars, or salt.  Get regular exercise. This is one of the most important things you can do for your health.  Most adults should exercise for at least 150 minutes each week. The exercise should increase your heart rate and make you sweat (moderate-intensity exercise).  Most adults should also do strengthening exercises at least twice a week. This is in addition to the moderate-intensity exercise.  Maintain a healthy weight  Body mass index (BMI) is a measurement that can be used to identify possible weight problems. It estimates body fat based on height and weight. Your health care provider can help determine your BMI and help you achieve or maintain a healthy weight.  For females 20 years of age and older:   A BMI below 18.5 is considered underweight.  A BMI of 18.5 to 24.9 is normal.  A BMI of 25 to 29.9 is considered overweight.  A BMI of 30 and above is considered obese.  Watch levels of cholesterol and blood lipids  You should start having your blood tested for lipids and cholesterol at 49 years of age, then have this test every 5 years.  You may need to have your cholesterol levels checked more often if:  Your lipid  or cholesterol levels are high.  You are older than 50 years of age.  You are at high risk for heart disease.  CANCER SCREENING   Lung Cancer  Lung cancer screening is recommended for adults 55-80 years old who are at high risk for lung cancer because of a history of smoking.  A yearly low-dose CT scan of the lungs is recommended for people who:  Currently smoke.  Have quit within the past 15 years.  Have at least a 30-pack-year history of smoking. A pack year is smoking an average of one pack of cigarettes a day for 1 year.  Yearly screening should continue until it has been 15 years since you quit.  Yearly screening should stop if you develop a health problem that would prevent you from having lung cancer treatment.  Breast Cancer  Practice breast self-awareness. This means understanding how your breasts normally appear and feel.  It also means doing regular breast self-exams. Let your health care provider know about any changes, no matter how small.  If you are in your 20s or 30s, you should have a clinical breast exam (CBE) by a health care provider every 1-3 years as part of a regular health exam.  If you are 40 or older, have a CBE every year. Also consider having a breast X-ray (mammogram) every year.  If you have a family history of breast cancer, talk to your health care provider about genetic screening.  If you   are at high risk for breast cancer, talk to your health care provider about having an MRI and a mammogram every year.  Breast cancer gene (BRCA) assessment is recommended for women who have family members with BRCA-related cancers. BRCA-related cancers include:  Breast.  Ovarian.  Tubal.  Peritoneal cancers.  Results of the assessment will determine the need for genetic counseling and BRCA1 and BRCA2 testing. Cervical Cancer Your health care provider may recommend that you be screened regularly for cancer of the pelvic organs (ovaries, uterus, and  vagina). This screening involves a pelvic examination, including checking for microscopic changes to the surface of your cervix (Pap test). You may be encouraged to have this screening done every 3 years, beginning at age 21.  For women ages 30-65, health care providers may recommend pelvic exams and Pap testing every 3 years, or they may recommend the Pap and pelvic exam, combined with testing for human papilloma virus (HPV), every 5 years. Some types of HPV increase your risk of cervical cancer. Testing for HPV may also be done on women of any age with unclear Pap test results.  Other health care providers may not recommend any screening for nonpregnant women who are considered low risk for pelvic cancer and who do not have symptoms. Ask your health care provider if a screening pelvic exam is right for you.  If you have had past treatment for cervical cancer or a condition that could lead to cancer, you need Pap tests and screening for cancer for at least 20 years after your treatment. If Pap tests have been discontinued, your risk factors (such as having a new sexual partner) need to be reassessed to determine if screening should resume. Some women have medical problems that increase the chance of getting cervical cancer. In these cases, your health care provider may recommend more frequent screening and Pap tests. Colorectal Cancer  This type of cancer can be detected and often prevented.  Routine colorectal cancer screening usually begins at 50 years of age and continues through 49 years of age.  Your health care provider may recommend screening at an earlier age if you have risk factors for colon cancer.  Your health care provider may also recommend using home test kits to check for hidden blood in the stool.  A small camera at the end of a tube can be used to examine your colon directly (sigmoidoscopy or colonoscopy). This is done to check for the earliest forms of colorectal  cancer.  Routine screening usually begins at age 50.  Direct examination of the colon should be repeated every 5-10 years through 49 years of age. However, you may need to be screened more often if early forms of precancerous polyps or small growths are found. Skin Cancer  Check your skin from head to toe regularly.  Tell your health care provider about any new moles or changes in moles, especially if there is a change in a mole's shape or color.  Also tell your health care provider if you have a mole that is larger than the size of a pencil eraser.  Always use sunscreen. Apply sunscreen liberally and repeatedly throughout the day.  Protect yourself by wearing long sleeves, pants, a wide-brimmed hat, and sunglasses whenever you are outside. HEART DISEASE, DIABETES, AND HIGH BLOOD PRESSURE   High blood pressure causes heart disease and increases the risk of stroke. High blood pressure is more likely to develop in:  People who have blood pressure in the high end   of the normal range (130-139/85-89 mm Hg).  People who are overweight or obese.  People who are African American.  If you are 38-23 years of age, have your blood pressure checked every 3-5 years. If you are 61 years of age or older, have your blood pressure checked every year. You should have your blood pressure measured twice--once when you are at a hospital or clinic, and once when you are not at a hospital or clinic. Record the average of the two measurements. To check your blood pressure when you are not at a hospital or clinic, you can use:  An automated blood pressure machine at a pharmacy.  A home blood pressure monitor.  If you are between 45 years and 39 years old, ask your health care provider if you should take aspirin to prevent strokes.  Have regular diabetes screenings. This involves taking a blood sample to check your fasting blood sugar level.  If you are at a normal weight and have a low risk for diabetes,  have this test once every three years after 49 years of age.  If you are overweight and have a high risk for diabetes, consider being tested at a younger age or more often. PREVENTING INFECTION  Hepatitis B  If you have a higher risk for hepatitis B, you should be screened for this virus. You are considered at high risk for hepatitis B if:  You were born in a country where hepatitis B is common. Ask your health care provider which countries are considered high risk.  Your parents were born in a high-risk country, and you have not been immunized against hepatitis B (hepatitis B vaccine).  You have HIV or AIDS.  You use needles to inject street drugs.  You live with someone who has hepatitis B.  You have had sex with someone who has hepatitis B.  You get hemodialysis treatment.  You take certain medicines for conditions, including cancer, organ transplantation, and autoimmune conditions. Hepatitis C  Blood testing is recommended for:  Everyone born from [birth year] through 1964.  Anyone with known risk factors for hepatitis C. Sexually transmitted infections (STIs)  You should be screened for sexually transmitted infections (STIs) including gonorrhea and chlamydia if:  You are sexually active and are younger than 49 years of age.  You are older than 49 years of age and your health care provider tells you that you are at risk for this type of infection.  Your sexual activity has changed since you were last screened and you are at an increased risk for chlamydia or gonorrhea. Ask your health care provider if you are at risk.  If you do not have HIV, but are at risk, it may be recommended that you take a prescription medicine daily to prevent HIV infection. This is called pre-exposure prophylaxis (PrEP). You are considered at risk if:  You are sexually active and do not regularly use condoms or know the HIV status of your partner(s).  You take drugs by injection.  You are sexually  active with a partner who has HIV. Talk with your health care provider about whether you are at high risk of being infected with HIV. If you choose to begin PrEP, you should first be tested for HIV. You should then be tested every 3 months for as long as you are taking PrEP.  PREGNANCY   If you are premenopausal and you may become pregnant, ask your health care provider about preconception counseling.  If you may  become pregnant, take 400 to 800 micrograms (mcg) of folic acid every day.  If you want to prevent pregnancy, talk to your health care provider about birth control (contraception). OSTEOPOROSIS AND MENOPAUSE   Osteoporosis is a disease in which the bones lose minerals and strength with aging. This can result in serious bone fractures. Your risk for osteoporosis can be identified using a bone density scan.  If you are 61 years of age or older, or if you are at risk for osteoporosis and fractures, ask your health care provider if you should be screened.  Ask your health care provider whether you should take a calcium or vitamin D supplement to lower your risk for osteoporosis.  Menopause may have certain physical symptoms and risks.  Hormone replacement therapy may reduce some of these symptoms and risks. Talk to your health care provider about whether hormone replacement therapy is right for you.  HOME CARE INSTRUCTIONS   Schedule regular health, dental, and eye exams.  Stay current with your immunizations.   Do not use any tobacco products including cigarettes, chewing tobacco, or electronic cigarettes.  If you are pregnant, do not drink alcohol.  If you are breastfeeding, limit how much and how often you drink alcohol.  Limit alcohol intake to no more than 1 drink per day for nonpregnant women. One drink equals 12 ounces of beer, 5 ounces of wine, or 1 ounces of hard liquor.  Do not use street drugs.  Do not share needles.  Ask your health care provider for help if  you need support or information about quitting drugs.  Tell your health care provider if you often feel depressed.  Tell your health care provider if you have ever been abused or do not feel safe at home.   This information is not intended to replace advice given to you by your health care provider. Make sure you discuss any questions you have with your health care provider.   Document Released: 12/24/2010 Document Revised: 07/01/2014 Document Reviewed: 05/12/2013 Elsevier Interactive Patient Education [employer].

## 2015-12-06 ENCOUNTER — Telehealth: Payer: Self-pay

## 2015-12-06 LAB — COMPREHENSIVE METABOLIC PANEL
ALBUMIN: 4.3 g/dL (ref 3.5–5.5)
ALK PHOS: 102 IU/L (ref 39–117)
ALT: 10 IU/L (ref 0–32)
AST: 16 IU/L (ref 0–40)
Albumin/Globulin Ratio: 1.2 (ref 1.2–2.2)
BUN / CREAT RATIO: 17 (ref 9–23)
BUN: 12 mg/dL (ref 6–24)
Bilirubin Total: 0.4 mg/dL (ref 0.0–1.2)
CHLORIDE: 98 mmol/L (ref 96–106)
CO2: 24 mmol/L (ref 18–29)
CREATININE: 0.72 mg/dL (ref 0.57–1.00)
Calcium: 9.8 mg/dL (ref 8.7–10.2)
GFR calc Af Amer: 114 mL/min/{1.73_m2} (ref >59)
GFR calc non Af Amer: 99 mL/min/{1.73_m2} (ref >59)
GLUCOSE: 94 mg/dL (ref 65–99)
Globulin, Total: 3.5 g/dL (ref 1.5–4.5)
Potassium: 4.4 mmol/L (ref 3.5–5.2)
Sodium: 140 mmol/L (ref 134–144)
TOTAL PROTEIN: 7.8 g/dL (ref 6.0–8.5)

## 2015-12-06 LAB — HEPATITIS B SURFACE ANTIBODY,QUALITATIVE: HEP B SURFACE AB, QUAL: NONREACTIVE

## 2015-12-06 LAB — LIPID PANEL
CHOLESTEROL TOTAL: 206 mg/dL — AB (ref 100–199)
Chol/HDL Ratio: 4.4 ratio units (ref 0.0–4.4)
HDL: 47 mg/dL (ref >39)
LDL CALC: 133 mg/dL — AB (ref 0–99)
Triglycerides: 128 mg/dL (ref 0–149)
VLDL CHOLESTEROL CAL: 26 mg/dL (ref 5–40)

## 2015-12-06 LAB — CBC WITH DIFFERENTIAL/PLATELET
BASOS ABS: 0 10*3/uL (ref 0.0–0.2)
Basos: 0 %
EOS (ABSOLUTE): 0.3 10*3/uL (ref 0.0–0.4)
Eos: 3 %
HEMOGLOBIN: 11.9 g/dL (ref 11.1–15.9)
Hematocrit: 38.1 % (ref 34.0–46.6)
IMMATURE GRANS (ABS): 0 10*3/uL (ref 0.0–0.1)
Immature Granulocytes: 0 %
LYMPHS: 21 %
Lymphocytes Absolute: 2 10*3/uL (ref 0.7–3.1)
MCH: 24.1 pg — ABNORMAL LOW (ref 26.6–33.0)
MCHC: 31.2 g/dL — AB (ref 31.5–35.7)
MCV: 77 fL — ABNORMAL LOW (ref 79–97)
MONOCYTES: 6 %
Monocytes Absolute: 0.6 10*3/uL (ref 0.1–0.9)
NEUTROS PCT: 70 %
Neutrophils Absolute: 6.5 10*3/uL (ref 1.4–7.0)
PLATELETS: 409 10*3/uL — AB (ref 150–379)
RBC: 4.94 x10E6/uL (ref 3.77–5.28)
RDW: 16.5 % — ABNORMAL HIGH (ref 12.3–15.4)
WBC: 9.4 10*3/uL (ref 3.4–10.8)

## 2015-12-06 LAB — TSH: TSH: 8.87 u[IU]/mL — ABNORMAL HIGH (ref 0.450–4.500)

## 2015-12-06 LAB — VITAMIN D 25 HYDROXY (VIT D DEFICIENCY, FRACTURES): Vit D, 25-Hydroxy: 17.4 ng/mL — ABNORMAL LOW (ref 30.0–100.0)

## 2015-12-06 MED ORDER — VITAMIN D (ERGOCALCIFEROL) 1.25 MG (50000 UNIT) PO CAPS: 50000.0000 [IU] | ORAL_CAPSULE | ORAL | Status: DC

## 2015-12-06 MED ORDER — LEVOTHYROXINE SODIUM 25 MCG PO TABS: 25.0000 ug | ORAL_TABLET | Freq: Every day | ORAL | Status: DC

## 2015-12-06 NOTE — Telephone Encounter (Signed)
-----   Message from Margaretann LovelessJennifer M Burnette, [place]C sent at 12/06/2015  9:28 AM EDT ----- All labs are within normal limits and stable with exception of thyroid and vit D. Will add medication for thyroid and recheck lab in 6-8 weeks. Will also send in vitamin D supplement that you will take once per week.  Thanks! -JB

## 2015-12-06 NOTE — Addendum Note (Signed)
Addended by: Margaretann LovelessBURNETTE, Finas Delone M on: 12/06/2015 09:31 AM   Modules accepted: Orders, SmartSet

## 2015-12-06 NOTE — Telephone Encounter (Signed)
No known cause. It is very common, especially females, for the thyroid gland to not uptake the thyroid stimulating hormone (TSH) as well as it should. TSH is released from pituitary gland and absorbed by the thyroid gland and turned into T3 and T4 for the body to use. This just means thyroid is not uptaking the TSH to make T3 and T4. This is called hypothyroidism. It will cause low energy, fatigue, weight gain, dry skin, nails and hair. Can cause other things as well but these are most common. She can research hypothyroidism to learn more.

## 2015-12-06 NOTE — Telephone Encounter (Signed)
Patient advised as directed below. She wants to know what cause for her thyroid going high??  She will call a day before she goes to the lab.  Thanks,  -Brooklyn Jeff

## 2015-12-06 NOTE — Telephone Encounter (Signed)
Patient advised as directed below.  Thanks,  -Joseline 

## 2015-12-07 LAB — PAP IG AND HPV HIGH-RISK
HPV, HIGH-RISK: POSITIVE — AB
PAP Smear Comment: 0

## 2015-12-07 NOTE — Addendum Note (Signed)
Addended by: Margaretann LovelessBURNETTE, Donatello Kleve M on: 12/07/2015 05:03 PM   Modules accepted: Orders, SmartSet

## 2015-12-21 ENCOUNTER — Other Ambulatory Visit: Payer: Self-pay | Admitting: Physician Assistant

## 2015-12-21 ENCOUNTER — Ambulatory Visit
Admission: RE | Admit: 2015-12-21 | Discharge: 2015-12-21 | Disposition: A | Discharge status: Home / Self Care | Payer: 59 | Source: Ambulatory Visit | Attending: Physician Assistant | Admitting: Physician Assistant

## 2015-12-21 ENCOUNTER — Telehealth: Payer: Self-pay

## 2015-12-21 DIAGNOSIS — Z1231 Encounter for screening mammogram for malignant neoplasm of breast: Secondary | ICD-10-CM | POA: Insufficient documentation

## 2015-12-21 DIAGNOSIS — Z1239 Encounter for other screening for malignant neoplasm of breast: Secondary | ICD-10-CM

## 2015-12-21 NOTE — Telephone Encounter (Signed)
-----   Message from Margaretann LovelessJennifer M Burnette, [place]C sent at 12/21/2015  1:42 PM EDT ----- Normal mammogram. Repeat screening in one year.

## 2015-12-21 NOTE — Telephone Encounter (Signed)
Patient advised as directed below.  Thanks.  -Monasia Lair 

## 2016-01-26 ENCOUNTER — Telehealth: Payer: Self-pay | Admitting: Physician Assistant

## 2016-01-26 DIAGNOSIS — E039 Hypothyroidism, unspecified: Secondary | ICD-10-CM

## 2016-01-26 NOTE — Telephone Encounter (Signed)
Lab slip printed and will be up front for pick up.

## 2016-01-26 NOTE — Telephone Encounter (Signed)
Pt states after she has a CPE pt was advised that she would need to have her labs rechecked in 6 weeks.  Pt calling to request a lab slip.  CB#[phone]

## 2016-01-26 NOTE — Telephone Encounter (Signed)
Pt advised.   Thanks,   -Ascher Schroepfer  

## 2016-01-30 ENCOUNTER — Ambulatory Visit (INDEPENDENT_AMBULATORY_CARE_PROVIDER_SITE_OTHER): Payer: 59 | Admitting: Physician Assistant

## 2016-01-30 ENCOUNTER — Encounter: Payer: Self-pay | Admitting: Physician Assistant

## 2016-01-30 VITALS — BP 102/68 | HR 72 | Temp 97.8°F | Resp 16 | Wt 189.0 lb

## 2016-01-30 DIAGNOSIS — J0140 Acute pansinusitis, unspecified: Secondary | ICD-10-CM | POA: Diagnosis not present

## 2016-01-30 DIAGNOSIS — J3489 Other specified disorders of nose and nasal sinuses: Secondary | ICD-10-CM

## 2016-01-30 MED ORDER — AMOXICILLIN-POT CLAVULANATE 875-125 MG PO TABS: 1.0000 | ORAL_TABLET | Freq: Two times a day (BID) | ORAL | 0 refills | Status: AC

## 2016-01-30 MED ORDER — MUPIROCIN 2 % EX OINT: 1.0000 "application " | TOPICAL_OINTMENT | Freq: Two times a day (BID) | CUTANEOUS | 0 refills | Status: AC

## 2016-01-30 NOTE — Progress Notes (Signed)
Patient: Shelby Graham Female    DOB: 03/16/1966   50 y.o.   MRN: [id number] Visit Date: 01/30/2016  Today's Provider: Margaretann LovelessJennifer M Reino Lybbert, PA-C   Chief Complaint  Patient presents with  . Sinusitis   Subjective:    HPI Sinus Pain: Patient complains of right ear pressure/pain, congestion, facial pain, headache described as presusre, nasal congestion, no  fever, sinus pressure, sneezing and sore throat. Onset of symptoms was 2 weeks ago, gradually worsening since that time. Past history is significant for chronic sinus infections, pulmonary fibrosis. Patient is non-smoker. Pt also c/o red, itchy left eye. Is concerned of possible conjunctivitis.     Allergies  Allergen Reactions  . Percocet [Oxycodone-Acetaminophen] Hives and Itching   Current Meds  Medication Sig  . azithromycin (ZITHROMAX) 250 MG tablet Take 1 tablet (250 mg total) by mouth daily.  Marland Kitchen. esomeprazole (NEXIUM) 40 MG capsule   . levothyroxine (SYNTHROID, LEVOTHROID) 25 MCG tablet Take 1 tablet (25 mcg total) by mouth daily before breakfast.  . mycophenolate (CELLCEPT) 500 MG tablet Take by mouth.   . ondansetron (ZOFRAN) 4 MG tablet Take 1 tablet (4 mg total) by mouth every 8 (eight) hours as needed for nausea or vomiting.  . ranitidine (ZANTAC) 150 MG capsule Take by mouth.  . sertraline (ZOLOFT) 50 MG tablet Take 50 mg by mouth.  . Vitamin D, Ergocalciferol, (DRISDOL) 50000 units CAPS capsule Take 1 capsule (50,000 Units total) by mouth every 7 (seven) days.  Marland Kitchen. zolpidem (AMBIEN) 5 MG tablet Take 5 mg by mouth.  . [DISCONTINUED] Naltrexone-Bupropion HCl ER 8-90 MG TB12 Take 1 tab PO daily x 1 week, 1 tab PO BID x 1 week, then 2 tabs PO q am and 1 tab PO q pm x 1 week, then 2 tabs PO BID.    Review of Systems  Constitutional: Positive for fatigue. Negative for chills and fever.  HENT: Positive for congestion, ear pain, postnasal drip, rhinorrhea, sinus pressure, sneezing, sore throat and voice change. Negative  for tinnitus.   Eyes: Positive for redness and itching. Negative for photophobia, pain, discharge and visual disturbance.  Respiratory: Positive for cough, shortness of breath and wheezing.   Cardiovascular: Negative for chest pain, palpitations and leg swelling.  Gastrointestinal: Negative for abdominal pain and nausea.  Neurological: Positive for headaches. Negative for dizziness.    Social History  Substance Use Topics  . Smoking status: Never Smoker  . Smokeless tobacco: Never Used  . Alcohol use No   Objective:   BP 102/68 (BP Location: Left Arm, Patient Position: Sitting, Cuff Size: Large)   Pulse 72   Temp 97.8 F (36.6 C) (Oral)   Resp 16   Wt 189 lb (85.7 kg)   LMP 09/03/2014 (Within Months)   SpO2 93% Comment: on 5 L O2  BMI 34.57 kg/m   Physical Exam  Constitutional: She appears well-developed and well-nourished. No distress.  HENT:  Head: Normocephalic and atraumatic.  Right Ear: Hearing, tympanic membrane, external ear and ear canal normal.  Left Ear: Hearing, external ear and ear canal normal. A middle ear effusion is present.  Nose: Mucosal edema and rhinorrhea present. Right sinus exhibits maxillary sinus tenderness and frontal sinus tenderness. Left sinus exhibits maxillary sinus tenderness and frontal sinus tenderness.  Mouth/Throat: Uvula is midline and mucous membranes are normal. Posterior oropharyngeal erythema present. No oropharyngeal exudate or posterior oropharyngeal edema.  Small sores noted in bilateral nares on the bridge side. Not actively  bleeding  Neck: Normal range of motion. Neck supple. No tracheal deviation present. No thyromegaly present.  Cardiovascular: Normal rate, regular rhythm and normal heart sounds.  Exam reveals no gallop and no friction rub.   No murmur heard. Pulmonary/Chest: Effort normal. No stridor. No respiratory distress. She has decreased breath sounds. She has no wheezes. She has no rhonchi. She has no rales.    Lymphadenopathy:    She has no cervical adenopathy.  Skin: She is not diaphoretic.  Vitals reviewed.     Assessment & Plan:     1. Acute pansinusitis, recurrence not specified Will have her hold her azithromycin prophylaxis for which she takes for her immune suppressed state daily and start augmentin as below. She may take tylenol for fever and body aches. She needs to stay well hydrated and get plenty of rest. She is to restart her once daily azithromycin when she completes the augmentin. She is to call the office if symptoms fail to improve or worsen. - amoxicillin-clavulanate (AUGMENTIN) 875-125 MG tablet; Take 1 tablet by mouth 2 (two) times daily.  Dispense: 20 tablet; Refill: 0  2. Nasal sore Bactroban ointment given as below for her to place on the nasal sores twice daily until healed. - mupirocin ointment (BACTROBAN) 2 %; Place 1 application into the nose 2 (two) times daily.  Dispense: 22 g; Refill: 0       Margaretann Loveless, PA-C  [facility]

## 2016-01-30 NOTE — Patient Instructions (Signed)

## 2016-01-31 ENCOUNTER — Telehealth: Payer: Self-pay

## 2016-01-31 LAB — THYROID PANEL WITH TSH
FREE THYROXINE INDEX: 1.1 — AB (ref 1.2–4.9)
T3 UPTAKE RATIO: 22 % — AB (ref 24–39)
T4, Total: 5 ug/dL (ref 4.5–12.0)
TSH: 5.81 u[IU]/mL — ABNORMAL HIGH (ref 0.450–4.500)

## 2016-01-31 MED ORDER — LEVOTHYROXINE SODIUM 50 MCG PO TABS: 50.0000 ug | ORAL_TABLET | Freq: Every day | ORAL | 1 refills | Status: DC

## 2016-01-31 NOTE — Addendum Note (Signed)
Addended by: Margaretann LovelessBURNETTE, JENNIFER M on: 01/31/2016 09:55 AM   Modules accepted: Orders

## 2016-01-31 NOTE — Telephone Encounter (Signed)
Patient advised as directed below.  Thanks,  -Andreya Lacks 

## 2016-01-31 NOTE — Telephone Encounter (Signed)
-----   Message from Margaretann LovelessJennifer M Burnette, [place]C sent at 01/31/2016  9:53 AM EDT ----- Thyroid slightly better but still not to goal. Will increase levothyroxine to 50mcg. We can recheck again in 8 weeks.

## 2016-04-03 ENCOUNTER — Encounter: Payer: Self-pay | Admitting: Physician Assistant

## 2016-04-25 ENCOUNTER — Encounter: Payer: Self-pay | Admitting: Physician Assistant

## 2016-05-29 ENCOUNTER — Other Ambulatory Visit: Payer: Self-pay | Admitting: Physician Assistant

## 2016-05-29 DIAGNOSIS — E039 Hypothyroidism, unspecified: Secondary | ICD-10-CM

## 2016-05-29 NOTE — Telephone Encounter (Signed)
Pt needs recheck of TSH. Synthroid was changed to 50 mcg in August. Allene DillonEmily Drozdowski, CMA

## 2016-06-20 ENCOUNTER — Other Ambulatory Visit: Payer: Self-pay | Admitting: Physician Assistant

## 2016-06-20 DIAGNOSIS — E039 Hypothyroidism, unspecified: Secondary | ICD-10-CM

## 2016-06-20 NOTE — Telephone Encounter (Signed)
Had CPE 12/05/2015. Allene DillonEmily Drozdowski, CMA

## 2016-08-20 ENCOUNTER — Encounter: Payer: 59 | Attending: Cardiovascular Disease | Admitting: Respiratory Therapy

## 2016-08-20 VITALS — Ht 62.5 in | Wt 187.5 lb

## 2016-08-20 DIAGNOSIS — J849 Interstitial pulmonary disease, unspecified: Secondary | ICD-10-CM | POA: Diagnosis not present

## 2016-08-20 DIAGNOSIS — J8410 Pulmonary fibrosis, unspecified: Secondary | ICD-10-CM

## 2016-08-20 DIAGNOSIS — I5022 Chronic systolic (congestive) heart failure: Secondary | ICD-10-CM | POA: Diagnosis not present

## 2016-08-20 NOTE — Patient Instructions (Signed)
Patient Instructions  Patient Details  Name: PRESLEY GORA MRN: [id number] Date of Birth: [birth year] Referring Provider:  Sigurd Sos, MD  Below are the personal goals you chose as well as exercise and nutrition goals. Our goal is to help you keep on track towards obtaining and maintaining your goals. We will be discussing your progress on these goals with you throughout the program.  Initial Exercise Prescription:     Initial Exercise Prescription - 08/20/16 1200      Date of Initial Exercise RX and Referring Provider   Date 08/20/16   Referring Provider Dudley Major     Oxygen   Oxygen Continuous   Liters 6-10     Treadmill   MPH 1.8   Grade 0   Minutes 15   METs 2.38     NuStep   Level 2   SPM 80   Minutes 15   METs 2.4     REL-XR   Level 2   Speed 60   Minutes 15   METs 2.4     Prescription Details   Frequency (times per week) 3   Duration Progress to 45 minutes of aerobic exercise without signs/symptoms of physical distress     Intensity   THRR 40-80% of Max Heartrate 102-148   Ratings of Perceived Exertion 11-15   Perceived Dyspnea 0-4     Resistance Training   Training Prescription Yes   Weight 3   Reps 10-15      Exercise Goals: Frequency: Be able to perform aerobic exercise three times per week working toward 3-5 days per week.  Intensity: Work with a perceived exertion of 11 (fairly light) - 15 (hard) as tolerated. Follow your new exercise prescription and watch for changes in prescription as you progress with the program. Changes will be reviewed with you when they are made.  Duration: You should be able to do 30 minutes of continuous aerobic exercise in addition to a 5 minute warm-up and a 5 minute cool-down routine.  Nutrition Goals: Your personal nutrition goals will be established when you do your nutrition analysis with the dietician.  The following are nutrition guidelines to follow: Cholesterol < 200mg /day Sodium < 1500mg /day Fiber:  Women under 50 yrs - 25 grams per day  Personal Goals:     Personal Goals and Risk Factors at Admission - 08/20/16 1146      Core Components/Risk Factors/Patient Goals on Admission    Weight Management Yes   Intervention Weight Management: Develop a combined nutrition and exercise program designed to reach desired caloric intake, while maintaining appropriate intake of nutrient and fiber, sodium and fats, and appropriate energy expenditure required for the weight goal.;Weight Management: Provide education and appropriate resources to help participant work on and attain dietary goals.   Admit Weight 186 lb (84.4 kg)   Goal Weight: Short Term 180 lb (81.6 kg)   Goal Weight: Long Term 163 lb (73.9 kg)   Expected Outcomes Short Term: Continue to assess and modify interventions until short term weight is achieved;Long Term: Adherence to nutrition and physical activity/exercise program aimed toward attainment of established weight goal;Weight Loss: Understanding of general recommendations for a balanced deficit meal plan, which promotes 1-2 lb weight loss per week and includes a negative energy balance of [phone] kcal/d;Weight Maintenance: Understanding of the daily nutrition guidelines, which includes 25-35% calories from fat, 7% or less cal from saturated fats, less than 200mg  cholesterol, less than 1.5gm of sodium, & 5 or more servings of  fruits and vegetables daily;Understanding recommendations for meals to include 15-35% energy as protein, 25-35% energy from fat, 35-60% energy from carbohydrates, less than 200mg  of dietary cholesterol, 20-35 gm of total fiber daily;Understanding of distribution of calorie intake throughout the day with the consumption of 4-5 meals/snacks   Improve shortness of breath with ADL's Yes   Intervention Provide education, individualized exercise plan and daily activity instruction to help decrease symptoms of SOB with activities of daily living.   Expected Outcomes Short  Term: Achieves a reduction of symptoms when performing activities of daily living.   Develop more efficient breathing techniques such as purse lipped breathing and diaphragmatic breathing; and practicing self-pacing with activity Yes   Intervention Provide education, demonstration and support about specific breathing techniuqes utilized for more efficient breathing. Include techniques such as pursed lipped breathing, diaphragmatic breathing and self-pacing activity.   Expected Outcomes Short Term: Participant will be able to demonstrate and use breathing techniques as needed throughout daily activities.   Increase knowledge of respiratory medications and ability to use respiratory devices properly  Yes  6-10 liters/min   Intervention Provide education and demonstration as needed of appropriate use of medications, inhalers, and oxygen therapy.   Expected Outcomes Short Term: Achieves understanding of medications use. Understands that oxygen is a medication prescribed by physician. Demonstrates appropriate use of inhaler and oxygen therapy.   Heart Failure Yes   Intervention Provide a combined exercise and nutrition program that is supplemented with education, support and counseling about heart failure. Directed toward relieving symptoms such as shortness of breath, decreased exercise tolerance, and extremity edema.   Expected Outcomes Improve functional capacity of life;Short term: Attendance in program 2-3 days a week with increased exercise capacity. Reported lower sodium intake. Reported increased fruit and vegetable intake. Reports medication compliance.;Short term: Daily weights obtained and reported for increase. Utilizing diuretic protocols set by physician.;Long term: Adoption of self-care skills and reduction of barriers for early signs and symptoms recognition and intervention leading to self-care maintenance.      Tobacco Use Initial Evaluation: History  Smoking Status   Never Smoker   Smokeless Tobacco   Never Used    Copy of goals given to participant.

## 2016-08-20 NOTE — Progress Notes (Signed)
Pulmonary Individual Treatment Plan  Patient Details  Name: EMANUELLA NICKLE MRN: [id number] Date of Birth: 1965/07/10 Referring Provider:   Flowsheet Row Pulmonary Rehab from 08/20/2016 in [facility] Cardiac and Pulmonary Rehab  Referring Provider  Dudley Major      Initial Encounter Date:  Flowsheet Row Pulmonary Rehab from 08/20/2016 in [facility] Cardiac and Pulmonary Rehab  Date  08/20/16  Referring Provider  Dudley Major      Visit Diagnosis: Pulmonary fibrosis, unspecified (HCC)  Patient's Home Medications on Admission:  Current Outpatient Prescriptions:    amoxicillin-clavulanate (AUGMENTIN) 875-125 MG tablet, Take 1 tablet by mouth 2 (two) times daily., Disp: 20 tablet, Rfl: 0   azithromycin (ZITHROMAX) 250 MG tablet, Take 1 tablet (250 mg total) by mouth daily., Disp: 30 tablet, Rfl: 0   esomeprazole (NEXIUM) 40 MG capsule, , Disp: , Rfl:    levothyroxine (SYNTHROID, LEVOTHROID) 50 MCG tablet, TAKE 1 TABLET (50 MCG TOTAL) BY MOUTH DAILY BEFORE BREAKFAST., Disp: 30 tablet, Rfl: 5   mupirocin ointment (BACTROBAN) 2 %, Place 1 application into the nose 2 (two) times daily., Disp: 22 g, Rfl: 0   mycophenolate (CELLCEPT) 500 MG tablet, Take by mouth. , Disp: , Rfl:    ondansetron (ZOFRAN) 4 MG tablet, Take 1 tablet (4 mg total) by mouth every 8 (eight) hours as needed for nausea or vomiting., Disp: 20 tablet, Rfl: 0   ranitidine (ZANTAC) 150 MG capsule, Take by mouth., Disp: , Rfl:    sertraline (ZOLOFT) 50 MG tablet, Take 50 mg by mouth., Disp: , Rfl:    Vitamin D, Ergocalciferol, (DRISDOL) 50000 units CAPS capsule, Take 1 capsule (50,000 Units total) by mouth every 7 (seven) days., Disp: 4 capsule, Rfl: 6   zolpidem (AMBIEN) 5 MG tablet, Take 5 mg by mouth., Disp: , Rfl:   Past Medical History: Past Medical History:  Diagnosis Date   Allergic rhinitis    Anemia    Depression    History of torn meniscus of left knee 2005   surgical repair of meniscus   Hyperlipidemia    Lung disease     NSIP (nonspecific interstitial pneumonia) (HCC)    Postinflammatory pulmonary fibrosis (HCC)    Shortness of breath dyspnea    Vitamin D deficiency     Tobacco Use: History  Smoking Status   Never Smoker  Smokeless Tobacco   Never Used    Labs: Recent Review Flowsheet Data    Labs for ITP Cardiac and Pulmonary Rehab Latest Ref Rng & Units 12/05/2015   Cholestrol 100 - 199 mg/dL 782(N)   LDLCALC 0 - 99 mg/dL 562(Z)   HDL >30 mg/dL 47   Trlycerides 0 - 865 mg/dL 784       ADL UCSD:     Pulmonary Assessment Scores    Row Name 08/20/16 1143         ADL UCSD   ADL Phase Entry     SOB Score total 95     Rest 1     Walk 3     Stairs 5     Bath 5     Dress 4     Shop 4        Pulmonary Function Assessment:   Exercise Target Goals: Date: 08/20/16  Exercise Program Goal: Individual exercise prescription set with THRR, safety & activity barriers. Participant demonstrates ability to understand and report RPE using BORG scale, to self-measure pulse accurately, and to acknowledge the importance of the exercise prescription.  Exercise Prescription Goal:  Starting with aerobic activity 30 plus minutes a day, 3 days per week for initial exercise prescription. Provide home exercise prescription and guidelines that participant acknowledges understanding prior to discharge.  Activity Barriers & Risk Stratification:   6 Minute Walk:     6 Minute Walk    Row Name 08/20/16 1211         6 Minute Walk   Distance 825 feet     Walk Time 4.75 minutes     # of Rest Breaks 1     MPH 1.97     METS 2.85     RPE 13     Perceived Dyspnea  3     VO2 Peak 9.99     Symptoms Yes (comment)     Comments O2 sats dropped to 84 at 2:29 - she stopped until it reached 88 and then continued until 6 min was achieved.     Resting HR 56 bpm     Resting BP 104/60     Max Ex. HR 102 bpm     Max Ex. BP 134/74       Interval HR   Baseline HR 56     2 Minute HR 102     5  Minute HR 91     6 Minute HR 98     2 Minute Post HR 65     Interval Heart Rate? Yes       Interval Oxygen   Interval Oxygen? Yes     Baseline Oxygen Saturation % 99 %     Baseline Liters of Oxygen 10 L     1 Minute Oxygen Saturation % 90 %     1 Minute Liters of Oxygen 10 L     2 Minute Oxygen Saturation % 87 %     2 Minute Liters of Oxygen 10 L     3 Minute Liters of Oxygen 10 L     4 Minute Oxygen Saturation % 88 %     4 Minute Liters of Oxygen 10 L     5 Minute Oxygen Saturation % 91 %     5 Minute Liters of Oxygen 10 L     6 Minute Oxygen Saturation % 84 %     6 Minute Liters of Oxygen 10 L     2 Minute Post Oxygen Saturation % 88 %     2 Minute Post Liters of Oxygen 10 L       Oxygen Initial Assessment:   Oxygen Re-Evaluation:   Oxygen Discharge (Final Oxygen Re-Evaluation):   Initial Exercise Prescription:     Initial Exercise Prescription - 08/20/16 1200      Date of Initial Exercise RX and Referring Provider   Date 08/20/16   Referring Provider Dudley Major     Oxygen   Oxygen Continuous   Liters 6-10     Treadmill   MPH 1.8   Grade 0   Minutes 15   METs 2.38     NuStep   Level 2   SPM 80   Minutes 15   METs 2.4     REL-XR   Level 2   Speed 60   Minutes 15   METs 2.4     Prescription Details   Frequency (times per week) 3   Duration Progress to 45 minutes of aerobic exercise without signs/symptoms of physical distress     Intensity   THRR 40-80% of Max Heartrate 102-148   Ratings of Perceived Exertion  11-15   Perceived Dyspnea 0-4     Resistance Training   Training Prescription Yes   Weight 3   Reps 10-15      Perform Capillary Blood Glucose checks as needed.  Exercise Prescription Changes:   Exercise Comments:   Exercise Goals and Review:   Exercise Goals Re-Evaluation :   Discharge Exercise Prescription (Final Exercise Prescription Changes):   Nutrition:  Target Goals: Understanding of nutrition guidelines, daily  intake of sodium 1500mg , cholesterol 200mg , calories 30% from fat and 7% or less from saturated fats, daily to have 5 or more servings of fruits and vegetables.  Biometrics:     Pre Biometrics - 08/20/16 1211      Pre Biometrics   Height 5' 2.5" (1.588 m)   Weight 187 lb 8 oz (85 kg)   Waist Circumference 34.5 inches   Hip Circumference 49 inches   Waist to Hip Ratio 0.7 %   BMI (Calculated) 33.8       Nutrition Therapy Plan and Nutrition Goals:   Nutrition Discharge: Rate Your Plate Scores:   Nutrition Goals Re-Evaluation:   Nutrition Goals Discharge (Final Nutrition Goals Re-Evaluation):   Psychosocial: Target Goals: Acknowledge presence or absence of significant depression and/or stress, maximize coping skills, provide positive support system. Participant is able to verbalize types and ability to use techniques and skills needed for reducing stress and depression.   Initial Review & Psychosocial Screening:     Initial Psych Review & Screening - 08/20/16 1149      Family Dynamics   Good Support System? Yes   Comments Ms Gingerich is preparing for a Lung Transplant. She meets on a regular basis with therapists at [facility]. Her son and daughter will be her caretakers throught the surgery. She is looking forward to [facility] - having supervised exercise.     Screening Interventions   Interventions Encouraged to exercise      Quality of Life Scores:     Quality of Life - 08/20/16 1152      Quality of Life Scores   Health/Function Pre 20.81 %   Socioeconomic Pre 20.38 %   Psych/Spiritual Pre 20.43 %   Family Pre 21 %   GLOBAL Pre 20.67 %      PHQ-9: Recent Review Flowsheet Data    Depression screen [facility] 2/9 08/20/2016   Decreased Interest 1   Down, Depressed, Hopeless 1   PHQ - 2 Score 2   Altered sleeping 3   Tired, decreased energy 1   Change in appetite 0   Feeling bad or failure about yourself  1   Trouble concentrating 0   Moving slowly  or fidgety/restless 0   Suicidal thoughts 0   PHQ-9 Score 7   Difficult doing work/chores Very difficult     Interpretation of Total Score  Total Score Depression Severity:  1-4 = Minimal depression, 5-9 = Mild depression, 10-14 = Moderate depression, 15-19 = Moderately severe depression, 20-27 = Severe depression   Psychosocial Evaluation and Intervention:   Psychosocial Re-Evaluation:   Psychosocial Discharge (Final Psychosocial Re-Evaluation):   Education: Education Goals: Education classes will be provided on a weekly basis, covering required topics. Participant will state understanding/return demonstration of topics presented.  Learning Barriers/Preferences:     Learning Barriers/Preferences - 08/20/16 1143      Learning Barriers/Preferences   Learning Barriers None   Learning Preferences None      Education Topics: Initial Evaluation Education: - Verbal, written and demonstration of respiratory  meds, RPE/PD scales, oximetry and breathing techniques. Instruction on use of nebulizers and MDIs: cleaning and proper use, rinsing mouth with steroid doses and importance of monitoring MDI activations. Flowsheet Row Pulmonary Rehab from 08/20/2016 in [facility] Cardiac and Pulmonary Rehab  Date  08/20/16  Educator  LB  Instruction Review Code  2- meets goals/outcomes      General Nutrition Guidelines/Fats and Fiber: -Group instruction provided by verbal, written material, models and posters to present the general guidelines for heart healthy nutrition. Gives an explanation and review of dietary fats and fiber.   Controlling Sodium/Reading Food Labels: -Group verbal and written material supporting the discussion of sodium use in heart healthy nutrition. Review and explanation with models, verbal and written materials for utilization of the food label.   Exercise Physiology & Risk Factors: - Group verbal and written instruction with models to review the exercise physiology of  the cardiovascular system and associated critical values. Details cardiovascular disease risk factors and the goals associated with each risk factor.   Aerobic Exercise & Resistance Training: - Gives group verbal and written discussion on the health impact of inactivity. On the components of aerobic and resistive training programs and the benefits of this training and how to safely progress through these programs.   Flexibility, Balance, General Exercise Guidelines: - Provides group verbal and written instruction on the benefits of flexibility and balance training programs. Provides general exercise guidelines with specific guidelines to those with heart or lung disease. Demonstration and skill practice provided.   Stress Management: - Provides group verbal and written instruction about the health risks of elevated stress, cause of high stress, and healthy ways to reduce stress.   Depression: - Provides group verbal and written instruction on the correlation between heart/lung disease and depressed mood, treatment options, and the stigmas associated with seeking treatment.   Exercise & Equipment Safety: - Individual verbal instruction and demonstration of equipment use and safety with use of the equipment.   Infection Prevention: - Provides verbal and written material to individual with discussion of infection control including proper hand washing and proper equipment cleaning during exercise session. Flowsheet Row Pulmonary Rehab from 08/20/2016 in [facility] Cardiac and Pulmonary Rehab  Date  08/20/16  Educator  LB  Instruction Review Code  2- meets goals/outcomes      Falls Prevention: - Provides verbal and written material to individual with discussion of falls prevention and safety. Flowsheet Row Pulmonary Rehab from 08/20/2016 in [facility] Cardiac and Pulmonary Rehab  Date  08/20/16  Educator  LB  Instruction Review Code  2- meets goals/outcomes      Diabetes: - Individual verbal  and written instruction to review signs/symptoms of diabetes, desired ranges of glucose level fasting, after meals and with exercise. Advice that pre and post exercise glucose checks will be done for 3 sessions at entry of program.   Chronic Lung Diseases: - Group verbal and written instruction to review new updates, new respiratory medications, new advancements in procedures and treatments. Provide informative websites and "800" numbers of self-education.   Lung Procedures: - Group verbal and written instruction to describe testing methods done to diagnose lung disease. Review the outcome of test results. Describe the treatment choices: Pulmonary Function Tests, ABGs and oximetry.   Energy Conservation: - Provide group verbal and written instruction for methods to conserve energy, plan and organize activities. Instruct on pacing techniques, use of adaptive equipment and posture/positioning to relieve shortness of breath.   Triggers: - Group verbal and written  instruction to review types of environmental controls: home humidity, furnaces, filters, dust mite/pet prevention, HEPA vacuums. To discuss weather changes, air quality and the benefits of nasal washing.   Exacerbations: - Group verbal and written instruction to provide: warning signs, infection symptoms, calling MD promptly, preventive modes, and value of vaccinations. Review: effective airway clearance, coughing and/or vibration techniques. Create an Sport and exercise psychologistAction Plan.   Oxygen: - Individual and group verbal and written instruction on oxygen therapy. Includes supplement oxygen, available portable oxygen systems, continuous and intermittent flow rates, oxygen safety, concentrators, and Medicare reimbursement for oxygen. Flow[facility]  Date  08/20/16  Educator  LB  Instruction Review Code  2- meets goals/outcomes      Respiratory Medications: - Group verbal and  written instruction to review medications for lung disease. Drug class, frequency, complications, importance of spacers, rinsing mouth after steroid MDI's, and proper cleaning methods for nebulizers.   AED/CPR: - Group verbal and written instruction with the use of models to demonstrate the basic use of the AED with the basic ABC's of resuscitation.   Breathing Retraining: - Provides individuals verbal and written instruction on purpose, frequency, and proper technique of diaphragmatic breathing and pursed-lipped breathing. Applies individual practice skills. Flowsheet [facility]  Date  08/20/16  Educator  LB  Instruction Review Code  2- meets goals/outcomes      Anatomy and Physiology of the Lungs: - Group verbal and written instruction with the use of models to provide basic lung anatomy and physiology related to function, structure and complications of lung disease.   Heart Failure: - Group verbal and written instruction on the basics of heart failure: signs/symptoms, treatments, explanation of ejection fraction, enlarged heart and cardiomyopathy.   Sleep Apnea: - Individual verbal and written instruction to review Obstructive Sleep Apnea. Review of risk factors, methods for diagnosing and types of masks and machines for OSA.   Anxiety: - Provides group, verbal and written instruction on the correlation between heart/lung disease and anxiety, treatment options, and management of anxiety.   Relaxation: - Provides group, verbal and written instruction about the benefits of relaxation for patients with heart/lung disease. Also provides patients with examples of relaxation techniques.   Knowledge Questionnaire Score:     Knowledge Questionnaire Score - 08/20/16 1143      Knowledge Questionnaire Score   Pre Score 9/10       Core Components/Risk Factors/Patient Goals at Admission:     Personal Goals and Risk  Factors at Admission - 08/20/16 1146      Core Components/Risk Factors/Patient Goals on Admission    Weight Management Yes   Intervention Weight Management: Develop a combined nutrition and exercise program designed to reach desired caloric intake, while maintaining appropriate intake of nutrient and fiber, sodium and fats, and appropriate energy expenditure required for the weight goal.;Weight Management: Provide education and appropriate resources to help participant work on and attain dietary goals.   Admit Weight 186 lb (84.4 kg)   Goal Weight: Short Term 180 lb (81.6 kg)   Goal Weight: Long Term 163 lb (73.9 kg)   Expected Outcomes Short Term: Continue to assess and modify interventions until short term weight is achieved;Long Term: Adherence to nutrition and physical activity/exercise program aimed toward attainment of established weight goal;Weight Loss: Understanding of general recommendations for a balanced deficit meal plan, which promotes 1-2 lb weight loss per week and includes  a negative energy balance of [phone] kcal/d;Weight Maintenance: Understanding of the daily nutrition guidelines, which includes 25-35% calories from fat, 7% or less cal from saturated fats, less than 200mg  cholesterol, less than 1.5gm of sodium, & 5 or more servings of fruits and vegetables daily;Understanding recommendations for meals to include 15-35% energy as protein, 25-35% energy from fat, 35-60% energy from carbohydrates, less than 200mg  of dietary cholesterol, 20-35 gm of total fiber daily;Understanding of distribution of calorie intake throughout the day with the consumption of 4-5 meals/snacks   Improve shortness of breath with ADL's Yes   Intervention Provide education, individualized exercise plan and daily activity instruction to help decrease symptoms of SOB with activities of daily living.   Expected Outcomes Short Term: Achieves a reduction of symptoms when performing activities of daily living.    Develop more efficient breathing techniques such as purse lipped breathing and diaphragmatic breathing; and practicing self-pacing with activity Yes   Intervention Provide education, demonstration and support about specific breathing techniuqes utilized for more efficient breathing. Include techniques such as pursed lipped breathing, diaphragmatic breathing and self-pacing activity.   Expected Outcomes Short Term: Participant will be able to demonstrate and use breathing techniques as needed throughout daily activities.   Increase knowledge of respiratory medications and ability to use respiratory devices properly  Yes  6-10 liters/min   Intervention Provide education and demonstration as needed of appropriate use of medications, inhalers, and oxygen therapy.   Expected Outcomes Short Term: Achieves understanding of medications use. Understands that oxygen is a medication prescribed by physician. Demonstrates appropriate use of inhaler and oxygen therapy.   Heart Failure Yes   Intervention Provide a combined exercise and nutrition program that is supplemented with education, support and counseling about heart failure. Directed toward relieving symptoms such as shortness of breath, decreased exercise tolerance, and extremity edema.   Expected Outcomes Improve functional capacity of life;Short term: Attendance in program 2-3 days a week with increased exercise capacity. Reported lower sodium intake. Reported increased fruit and vegetable intake. Reports medication compliance.;Short term: Daily weights obtained and reported for increase. Utilizing diuretic protocols set by physician.;Long term: Adoption of self-care skills and reduction of barriers for early signs and symptoms recognition and intervention leading to self-care maintenance.      Core Components/Risk Factors/Patient Goals Review:    Core Components/Risk Factors/Patient Goals at Discharge (Final Review):    ITP Comments:   Comments:  Ms Morais plans to start [facility] on 08/26/16 and will attend 3 days/week.

## 2016-08-26 ENCOUNTER — Encounter: Payer: 59 | Attending: Cardiovascular Disease | Admitting: *Deleted

## 2016-08-26 DIAGNOSIS — I5022 Chronic systolic (congestive) heart failure: Secondary | ICD-10-CM | POA: Insufficient documentation

## 2016-08-26 DIAGNOSIS — J8410 Pulmonary fibrosis, unspecified: Secondary | ICD-10-CM

## 2016-08-26 DIAGNOSIS — J849 Interstitial pulmonary disease, unspecified: Secondary | ICD-10-CM | POA: Diagnosis not present

## 2016-08-26 NOTE — Progress Notes (Signed)
Daily Session Note  Patient Details  Name: Shelby Graham MRN: [id number] Date of Birth: April 09, 1966 Referring Provider:   Flowsheet Row Pulmonary Rehab from 08/20/2016 in [facility] Cardiac and Pulmonary Rehab  Referring Provider  Farley Ly      Encounter Date: 08/26/2016  Check In:     Session Check In - 08/26/16 1126      Check-In   Location [facility]-Cardiac & Pulmonary Rehab   Staff Present Carson Myrtle, BS, RRT, Respiratory Dareen Piano, BA, ACSM CEP, Exercise Physiologist;Terrel Manalo Amedeo Plenty, BS, ACSM CEP, Exercise Physiologist   Supervising physician immediately available to respond to emergencies [facility] immediately available ER MD   Physician(s) Jimmye Norman and Corky Downs   Medication changes reported     No   Fall or balance concerns reported    No   Warm-up and Cool-down Performed as group-led instruction   Resistance Training Performed Yes   VAD Patient? No     Pain Assessment   Currently in Pain? No/denies   Multiple Pain Sites No           Exercise Prescription Changes - 08/26/16 1100      Response to Exercise   Blood Pressure (Admit) 104/60   Blood Pressure (Exercise) 118/66   Blood Pressure (Exit) 107/64   Heart Rate (Admit) 60 bpm   Heart Rate (Exercise) 87 bpm   Heart Rate (Exit) 50 bpm   Oxygen Saturation (Admit) 93 %   Oxygen Saturation (Exercise) 90 %   Oxygen Saturation (Exit) 99 %   Rating of Perceived Exertion (Exercise) 13   Perceived Dyspnea (Exercise) 3     Resistance Training   Training Prescription Yes   Weight 3   Reps 10-15     Oxygen   Oxygen Continuous   Liters 6-10     Treadmill   MPH 1.8   Grade 0   Minutes 15   METs 2.38     NuStep   Level 2   SPM 80   Minutes 15   METs 2.4     REL-XR   Level 2   Speed 60   Minutes 15   METs 2.4      History  Smoking Status  . Never Smoker  Smokeless Tobacco  . Never Used    Goals Met:  Proper associated with RPD/PD & O2 Sat Exercise tolerated well Personal goals  reviewed Strength training completed today  Goals Unmet:  Not Applicable  Comments: First full day of exercise!  Patient was oriented to gym and equipment including functions, settings, policies, and procedures.  Patient's individual exercise prescription and treatment plan were reviewed.  All starting workloads were established based on the results of the 6 minute walk test done at initial orientation visit.  The plan for exercise progression was also introduced and progression will be customized based on patient's performance and goals.    Dr. Emily Filbert is Medical Director for [place] and [facility] Pulmonary Rehabilitation.

## 2016-08-28 DIAGNOSIS — J8410 Pulmonary fibrosis, unspecified: Secondary | ICD-10-CM

## 2016-08-28 DIAGNOSIS — J849 Interstitial pulmonary disease, unspecified: Secondary | ICD-10-CM | POA: Diagnosis not present

## 2016-08-28 NOTE — Progress Notes (Signed)
Daily Session Note  Patient Details  Name: FINESSE FIELDER MRN: [id number] Date of Birth: 11-11-1965 Referring Provider:   Flowsheet Row Pulmonary Rehab from 08/20/2016 in [facility] Cardiac and Pulmonary Rehab  Referring Provider  Farley Ly      Encounter Date: 08/28/2016  Check In:     Session Check In - 08/28/16 1243      Check-In   Location [facility]-Cardiac & Pulmonary Rehab   Staff Present Alberteen Sam, MA, ACSM RCEP, Exercise Physiologist;Amanda Oletta Darter, BA, ACSM CEP, Exercise Physiologist;Laureen Owens Shark, BS, RRT, Respiratory Therapist   Supervising physician immediately available to respond to emergencies [facility] immediately available ER MD   Physician(s) Corky Downs and Archie Balboa   Medication changes reported     No   Fall or balance concerns reported    No   Warm-up and Cool-down Performed as group-led Location manager Performed Yes   VAD Patient? No     Pain Assessment   Currently in Pain? No/denies   Multiple Pain Sites No         History  Smoking Status  . Never Smoker  Smokeless Tobacco  . Never Used    Goals Met:  Proper associated with RPD/PD & O2 Sat Independence with exercise equipment Exercise tolerated well Strength training completed today  Goals Unmet:  Not Applicable  Comments: Pt able to follow exercise prescription today without complaint.  Will continue to monitor for progression.    Dr. Emily Filbert is Medical Director for [place] and [facility] Pulmonary Rehabilitation.

## 2016-08-30 ENCOUNTER — Encounter: Payer: 59 | Admitting: *Deleted

## 2016-08-30 DIAGNOSIS — J8410 Pulmonary fibrosis, unspecified: Secondary | ICD-10-CM

## 2016-08-30 DIAGNOSIS — J849 Interstitial pulmonary disease, unspecified: Secondary | ICD-10-CM | POA: Diagnosis not present

## 2016-08-30 NOTE — Progress Notes (Signed)
Daily Session Note  Patient Details  Name: Shelby Graham MRN: [id number] Date of Birth: [birth year] Referring Provider:   Flowsheet Row Pulmonary Rehab from 08/20/2016 in [facility]  Referring Provider  Farley Ly      Encounter Date: 08/30/2016  Check In:     Session Check In - 08/30/16 1122      Check-In   [place] stated her cardiologist has increased her metoprolol XR to 75 mg at night. Her BP today was low, but remained stable. She will discuss this with her cardiologist.          History  Smoking Status  . Never Smoker  Smokeless Tobacco  . Never Used    Goals Met:  Proper associated with RPD/PD & O2 Sat Independence with exercise equipment Using PLB without cueing & demonstrates good technique Exercise tolerated well Strength training completed today  Goals Unmet:  Not Applicable  Comments: Shelby Graham came in with lower than normal BP 102/58 while seated and stated she felt slightly dizzy when standing up. Her metoprolol was increased recently by her cardiologist and has been advised to monitor BP at home and call her cardiologist if continues to be low and symptomatic.  Stated she was more winded than normal but O2 sats were stable at 93% on 6L at rest. She was instructed no to get on the treadmill today and she only did seated exercise and was given water to drink. Her Oxygen demand during exercise increased to 8L and then 10L to maintain O2sat> or = 90%. Her blood pressure dropped slightly to 98/56 but remained stable for the duration of her exercise. Final BP after exercise was 94/52 and O2 sats maintained >90%.    Dr. Emily Filbert is Medical Director for [facility].

## 2016-09-02 ENCOUNTER — Encounter: Payer: Self-pay | Admitting: Respiratory Therapy

## 2016-09-02 DIAGNOSIS — J8410 Pulmonary fibrosis, unspecified: Secondary | ICD-10-CM

## 2016-09-02 NOTE — Progress Notes (Signed)
Pulmonary Individual Treatment Plan  Patient Details  Name: SIONA COULSTON MRN: [id number] Date of Birth: 1965/10/08 Referring Provider:   Flowsheet Row Pulmonary Rehab from 08/20/2016 in [facility] Cardiac and Pulmonary Rehab  Referring Provider  Farley Ly      Initial Encounter Date:  Flowsheet Row Pulmonary Rehab from 08/20/2016 in [facility] Cardiac and Pulmonary Rehab  Date  08/20/16  Referring Provider  Farley Ly      Visit Diagnosis: Pulmonary fibrosis, unspecified ([place])  Patient's Home Medications on Admission:  Current Outpatient Prescriptions:    amoxicillin-clavulanate (AUGMENTIN) 875-125 MG tablet, Take 1 tablet by mouth 2 (two) times daily., Disp: 20 tablet, Rfl: 0   azithromycin (ZITHROMAX) 250 MG tablet, Take 1 tablet (250 mg total) by mouth daily., Disp: 30 tablet, Rfl: 0   esomeprazole (NEXIUM) 40 MG capsule, , Disp: , Rfl:    levothyroxine (SYNTHROID, LEVOTHROID) 50 MCG tablet, TAKE 1 TABLET (50 MCG TOTAL) BY MOUTH DAILY BEFORE BREAKFAST., Disp: 30 tablet, Rfl: 5   lisinopril (PRINIVIL,ZESTRIL) 5 MG tablet, Take 5 mg by mouth., Disp: , Rfl:    metoprolol succinate (TOPROL-XL) 25 MG 24 hr tablet, Take 75 mg by mouth., Disp: , Rfl:    mupirocin ointment (BACTROBAN) 2 %, Place 1 application into the nose 2 (two) times daily., Disp: 22 g, Rfl: 0   mycophenolate (CELLCEPT) 500 MG tablet, Take by mouth. , Disp: , Rfl:    ondansetron (ZOFRAN) 4 MG tablet, Take 1 tablet (4 mg total) by mouth every 8 (eight) hours as needed for nausea or vomiting., Disp: 20 tablet, Rfl: 0   ranitidine (ZANTAC) 150 MG capsule, Take by mouth., Disp: , Rfl:    sertraline (ZOLOFT) 50 MG tablet, Take 50 mg by mouth., Disp: , Rfl:    Vitamin D, Ergocalciferol, (DRISDOL) 50000 units CAPS capsule, Take 1 capsule (50,000 Units total) by mouth every 7 (seven) days., Disp: 4 capsule, Rfl: 6   zolpidem (AMBIEN) 5 MG tablet, Take 5 mg by mouth., Disp: , Rfl:   Past Medical History: Past Medical History:   Diagnosis Date   Allergic rhinitis    Anemia    Depression    History of torn meniscus of left knee 2005   surgical repair of meniscus   Hyperlipidemia    Lung disease    NSIP (nonspecific interstitial pneumonia) (HCC)    Postinflammatory pulmonary fibrosis (HCC)    Shortness of breath dyspnea    Vitamin D deficiency     Tobacco Use: History  Smoking Status   Never Smoker  Smokeless Tobacco   Never Used    Labs: Recent Review Flowsheet Data    Labs for ITP Cardiac and Pulmonary Rehab Latest Ref Rng & Units 12/05/2015   Cholestrol 100 - 199 mg/dL 206(H)   LDLCALC 0 - 99 mg/dL 133(H)   HDL >39 mg/dL 47   Trlycerides 0 - 149 mg/dL 128       ADL UCSD:     Pulmonary Assessment Scores    Row Name 08/20/16 1143         ADL UCSD   ADL Phase Entry     SOB Score total 95     Rest 1     Walk 3     Stairs 5     Bath 5     Dress 4     Shop 4        Pulmonary Function Assessment:     Pulmonary Function Assessment - 08/26/16 0617  Pulmonary Function Tests   FVC% 41.3 %   FEV1% 50.9 %   FEV1/FVC Ratio 98.14   DLCO% 23.1 %     Initial Spirometry Results   Comments Test date      Exercise Target Goals:    Exercise Program Goal: Individual exercise prescription set with THRR, safety & activity barriers. Participant demonstrates ability to understand and report RPE using BORG scale, to self-measure pulse accurately, and to acknowledge the importance of the exercise prescription.  Exercise Prescription Goal: Starting with aerobic activity 30 plus minutes a day, 3 days per week for initial exercise prescription. Provide home exercise prescription and guidelines that participant acknowledges understanding prior to discharge.  Activity Barriers & Risk Stratification:   6 Minute Walk:     6 Minute Walk    Row Name 08/20/16 1211         6 Minute Walk   Distance 825 feet     Walk Time 4.75 minutes     # of Rest Breaks 1     MPH 1.97      METS 2.85     RPE 13     Perceived Dyspnea  3     VO2 Peak 9.99     Symptoms Yes (comment)     Comments O2 sats dropped to 84 at 2:29 - she stopped until it reached 88 and then continued until 6 min was achieved.     Resting HR 56 bpm     Resting BP 104/60     Max Ex. HR 102 bpm     Max Ex. BP 134/74       Interval HR   Baseline HR 56     2 Minute HR 102     5 Minute HR 91     6 Minute HR 98     2 Minute Post HR 65     Interval Heart Rate? Yes       Interval Oxygen   Interval Oxygen? Yes     Baseline Oxygen Saturation % 99 %     Baseline Liters of Oxygen 10 L     1 Minute Oxygen Saturation % 90 %     1 Minute Liters of Oxygen 10 L     2 Minute Oxygen Saturation % 87 %     2 Minute Liters of Oxygen 10 L     3 Minute Liters of Oxygen 10 L     4 Minute Oxygen Saturation % 88 %     4 Minute Liters of Oxygen 10 L     5 Minute Oxygen Saturation % 91 %     5 Minute Liters of Oxygen 10 L     6 Minute Oxygen Saturation % 84 %     6 Minute Liters of Oxygen 10 L     2 Minute Post Oxygen Saturation % 88 %     2 Minute Post Liters of Oxygen 10 L       Oxygen Initial Assessment:     Oxygen Initial Assessment - 08/28/16 1502      Home Oxygen   Home Oxygen Device E-Tanks   Sleep Oxygen Prescription Continuous   Liters per minute 6   Home Exercise Oxygen Prescription Continuous   Liters per minute --  6-10l/m   Home at Rest Exercise Oxygen Prescription Continuous  6-10l/m; uses simple mask with some activity   Compliance with Home Oxygen Use Yes     Initial 6 min Walk  Oxygen Used Continuous   Liters per minute 10   Resting Oxygen Saturation  during 6 min walk 99 %   Exercise Oxygen Saturation  during 6 min walk 84 %     Program Oxygen Prescription   Program Oxygen Prescription Continuous;E-Tanks   Liters per minute 8   Comments On treadmill, VentiMask on 55% at 15l/m     Intervention   Short Term Goals To learn and exhibit compliance with exercise, home and  travel O2 prescription;To learn and understand importance of monitoring SPO2 with pulse oximeter and demonstrate accurate use of the pulse oximeter.;To Learn and understand importance of maintaining oxygen saturations>88%;To learn and demonstrate proper purse lipped breathing techniques or other breathing techniques.   Long  Term Goals Exhibits compliance with exercise, home and travel O2 prescription;Verbalizes importance of monitoring SPO2 with pulse oximeter and return demonstration;Maintenance of O2 saturations>88%;Exhibits proper breathing techniques, such as purse lipped breathing or other method taught during program session      Oxygen Re-Evaluation:   Oxygen Discharge (Final Oxygen Re-Evaluation):   Initial Exercise Prescription:     Initial Exercise Prescription - 08/20/16 1200      Date of Initial Exercise RX and Referring Provider   Date 08/20/16   Referring Provider Farley Ly     Oxygen   Oxygen Continuous   Liters 6-10     Treadmill   MPH 1.8   Grade 0   Minutes 15   METs 2.38     NuStep   Level 2   SPM 80   Minutes 15   METs 2.4     REL-XR   Level 2   Speed 60   Minutes 15   METs 2.4     Prescription Details   Frequency (times per week) 3   Duration Progress to 45 minutes of aerobic exercise without signs/symptoms of physical distress     Intensity   THRR 40-80% of Max Heartrate 102-148   Ratings of Perceived Exertion 11-15   Perceived Dyspnea 0-4     Resistance Training   Training Prescription Yes   Weight 3   Reps 10-15      Perform Capillary Blood Glucose checks as needed.  Exercise Prescription Changes:     Exercise Prescription Changes    Row Name 08/26/16 1100             Response to Exercise   Blood Pressure (Admit) 104/60       Blood Pressure (Exercise) 118/66       Blood Pressure (Exit) 107/64       Heart Rate (Admit) 60 bpm       Heart Rate (Exercise) 87 bpm       Heart Rate (Exit) 50 bpm       Oxygen Saturation  (Admit) 93 %       Oxygen Saturation (Exercise) 90 %       Oxygen Saturation (Exit) 99 %       Rating of Perceived Exertion (Exercise) 13       Perceived Dyspnea (Exercise) 3         Resistance Training   Training Prescription Yes       Weight 3       Reps 10-15         Oxygen   Oxygen Continuous       Liters 6-10         Treadmill   MPH 1.8       Grade 0  Minutes 15       METs 2.38         NuStep   Level 2       SPM 80       Minutes 15       METs 2.4         REL-XR   Level 2       Speed 60       Minutes 15       METs 2.4          Exercise Comments:     Exercise Comments    Row Name 08/26/16 1134 08/30/16 1131         Exercise Comments First full day of exercise!  Patient was oriented to gym and equipment including functions, settings, policies, and procedures.  Patient's individual exercise prescription and treatment plan were reviewed.  All starting workloads were established based on the results of the 6 minute walk test done at initial orientation visit.  The plan for exercise progression was also introduced and progression will be customized based on patient's performance and goals Lelon Frohlich came in with lower than normal BP 102/58 while seated and stated she felt slightly dizzy when standing up. Her metoprolol was increased recently by her cardiologist and has been advised to monitor BP at home and call her cardiologist if continues to be low and symptomatic.  Stated she was more winded than normal but O2 sats were stable at 93% on 6L at rest. She was instructed no to get on the treadmill today and she only did seated exercise and was given water to drink. Her Oxygen demand during exercise increased to 8L and then 10L to maintain O2sat> or = 90%. Her blood pressure dropped slightly to 98/56 but remained stable for the duration of her exercise. Final BP after exercise was 94/52 and O2 sats maintained >90%.          Exercise Goals and Review:   Exercise Goals  Re-Evaluation :   Discharge Exercise Prescription (Final Exercise Prescription Changes):     Exercise Prescription Changes - 08/26/16 1100      Response to Exercise   Blood Pressure (Admit) 104/60   Blood Pressure (Exercise) 118/66   Blood Pressure (Exit) 107/64   Heart Rate (Admit) 60 bpm   Heart Rate (Exercise) 87 bpm   Heart Rate (Exit) 50 bpm   Oxygen Saturation (Admit) 93 %   Oxygen Saturation (Exercise) 90 %   Oxygen Saturation (Exit) 99 %   Rating of Perceived Exertion (Exercise) 13   Perceived Dyspnea (Exercise) 3     Resistance Training   Training Prescription Yes   Weight 3   Reps 10-15     Oxygen   Oxygen Continuous   Liters 6-10     Treadmill   MPH 1.8   Grade 0   Minutes 15   METs 2.38     NuStep   Level 2   SPM 80   Minutes 15   METs 2.4     REL-XR   Level 2   Speed 60   Minutes 15   METs 2.4      Nutrition:  Target Goals: Understanding of nutrition guidelines, daily intake of sodium <159m, cholesterol <2031m calories 30% from fat and 7% or less from saturated fats, daily to have 5 or more servings of fruits and vegetables.  Biometrics:     Pre Biometrics - 08/20/16 1211      Pre Biometrics  Height 5' 2.5" (1.588 m)   Weight 187 lb 8 oz (85 kg)   Waist Circumference 34.5 inches   Hip Circumference 49 inches   Waist to Hip Ratio 0.7 %   BMI (Calculated) 33.8       Nutrition Therapy Plan and Nutrition Goals:   Nutrition Discharge: Rate Your Plate Scores:   Nutrition Goals Re-Evaluation:   Nutrition Goals Discharge (Final Nutrition Goals Re-Evaluation):   Psychosocial: Target Goals: Acknowledge presence or absence of significant depression and/or stress, maximize coping skills, provide positive support system. Participant is able to verbalize types and ability to use techniques and skills needed for reducing stress and depression.   Initial Review & Psychosocial Screening:     Initial Psych Review & Screening -  08/20/16 [place]? Yes   Comments Ms Baria is preparing for a Lung Transplant. She meets on a regular basis with therapists at [facility]. Her son and daughter will be her caretakers throught the surgery. She is looking forward to [facility] - having supervised exercise.     Screening Interventions   Interventions Encouraged to exercise      Quality of Life Scores:     Quality of Life - 08/20/16 1152      Quality of Life Scores   Health/Function Pre 20.81 %   Socioeconomic Pre 20.38 %   Psych/Spiritual Pre 20.43 %   Family Pre 21 %   GLOBAL Pre 20.67 %      PHQ-9: Recent Review Flowsheet Data    Depression screen [facility] 2/9 08/20/2016   Decreased Interest 1   Down, Depressed, Hopeless 1   PHQ - 2 Score 2   Altered sleeping 3   Tired, decreased energy 1   Change in appetite 0   Feeling bad or failure about yourself  1   Trouble concentrating 0   Moving slowly or fidgety/restless 0   Suicidal thoughts 0   PHQ-9 Score 7   Difficult doing work/chores Very difficult     Interpretation of Total Score  Total Score Depression Severity:  1-4 = Minimal depression, 5-9 = Mild depression, 10-14 = Moderate depression, 15-19 = Moderately severe depression, 20-27 = Severe depression   Psychosocial Evaluation and Intervention:     Psychosocial Evaluation - 08/28/16 1108      Psychosocial Evaluation & Interventions   Interventions Encouraged to exercise with the program and follow exercise prescription;Stress management education;Relaxation education   Comments Counselor met today with Ms. Marva Panda") for initial psychosocial evaluation.  She is a 51 year old who has NSIP (Non-specific institutial pneumonia) and is on the transplant list for a double lung transplant.  She has a strong support system living with her daugher and her son, sisters and mother are close by.  Lelon Frohlich is also actively involved in her local church.  She reports not  sleeping well (3-4 hours typically per night) and has Ambien PRN to help with this; which is successful occasionally.  Lelon Frohlich has a history of depression and anxiety and is seeing a Transport planner and a psychiatrist to manage this.  She takes medication for her anxiety primarily and states that it helps.  She is out on disability and feels isolated at times with carrying an oxygen tank around with her everywhere.  She states she is typically in a good mood, but does feel more irritable lately.  Lelon Frohlich has goals to increase her stamina and strength; to eat better;  and to improve her mood and feelings of isolation by the socialization aspects of this program.  She will benefit from the psychoeducational components of this program as well.     Expected Outcomes Lelon Frohlich will consistently exercise; she will be educated on healthier eating and hopefully lose weight as a result; [name] hopes to improve her sleep and mood with sleep as well.  her ultimate goal is to be strong enough to get the double lung transplants.    Continue Psychosocial Services  Follow up required by staff      Psychosocial Re-Evaluation:   Psychosocial Discharge (Final Psychosocial Re-Evaluation):   Education: Education Goals: Education classes will be provided on a weekly basis, covering required topics. Participant will state understanding/return demonstration of topics presented.  Learning Barriers/Preferences:     Learning Barriers/Preferences - 08/20/16 1143      Learning Barriers/Preferences   Learning Barriers None   Learning Preferences None      Education Topics: Initial Evaluation Education: - Verbal, written and demonstration of respiratory meds, RPE/PD scales, oximetry and breathing techniques. Instruction on use of nebulizers and MDIs: cleaning and proper use, rinsing mouth with steroid doses and importance of monitoring MDI activations. Flowsheet Row Pulmonary Rehab from 08/28/2016 in [facility] Cardiac and Pulmonary Rehab  Date   08/20/16  Educator  LB  Instruction Review Code  2- meets goals/outcomes      General Nutrition Guidelines/Fats and Fiber: -Group instruction provided by verbal, written material, models and posters to present the general guidelines for heart healthy nutrition. Gives an explanation and review of dietary fats and fiber. Flowsheet Row Pulmonary Rehab from 08/28/2016 in [facility] Cardiac and Pulmonary Rehab  Date  08/26/16  Educator  CR  Instruction Review Code  2- meets goals/outcomes      Controlling Sodium/Reading Food Labels: -Group verbal and written material supporting the discussion of sodium use in heart healthy nutrition. Review and explanation with models, verbal and written materials for utilization of the food label.   Exercise Physiology & Risk Factors: - Group verbal and written instruction with models to review the exercise physiology of the cardiovascular system and associated critical values. Details cardiovascular disease risk factors and the goals associated with each risk factor.   Aerobic Exercise & Resistance Training: - Gives group verbal and written discussion on the health impact of inactivity. On the components of aerobic and resistive training programs and the benefits of this training and how to safely progress through these programs.   Flexibility, Balance, General Exercise Guidelines: - Provides group verbal and written instruction on the benefits of flexibility and balance training programs. Provides general exercise guidelines with specific guidelines to those with heart or lung disease. Demonstration and skill practice provided.   Stress Management: - Provides group verbal and written instruction about the health risks of elevated stress, cause of high stress, and healthy ways to reduce stress.   Depression: - Provides group verbal and written instruction on the correlation between heart/lung disease and depressed mood, treatment options, and the stigmas  associated with seeking treatment. Flowsheet Row Pulmonary Rehab from 08/28/2016 in [facility] Cardiac and Pulmonary Rehab  Date  08/28/16  Educator  [facility]  Instruction Review Code  2- meets goals/outcomes      Exercise & Equipment Safety: - Individual verbal instruction and demonstration of equipment use and safety with use of the equipment. Flowsheet Row Pulmonary Rehab from 08/28/2016 in [facility] Cardiac and Pulmonary Rehab  Date  08/26/16  Educator  AS  Instruction Review  Code  2- meets goals/outcomes      Infection Prevention: - Provides verbal and written material to individual with discussion of infection control including proper hand washing and proper equipment cleaning during exercise session. Flowsheet Row Pulmonary Rehab from 08/28/2016 in [facility] Cardiac and Pulmonary Rehab  Date  08/26/16  Educator  AS  Instruction Review Code  2- meets goals/outcomes      Falls Prevention: - Provides verbal and written material to individual with discussion of falls prevention and safety. Flowsheet Row Pulmonary Rehab from 08/28/2016 in [facility] Cardiac and Pulmonary Rehab  Date  08/20/16  Educator  LB  Instruction Review Code  2- meets goals/outcomes      Diabetes: - Individual verbal and written instruction to review signs/symptoms of diabetes, desired ranges of glucose level fasting, after meals and with exercise. Advice that pre and post exercise glucose checks will be done for 3 sessions at entry of program.   Chronic Lung Diseases: - Group verbal and written instruction to review new updates, new respiratory medications, new advancements in procedures and treatments. Provide informative websites and "800" numbers of self-education.   Lung Procedures: - Group verbal and written instruction to describe testing methods done to diagnose lung disease. Review the outcome of test results. Describe the treatment choices: Pulmonary Function Tests, ABGs and oximetry.   Energy Conservation: - Provide  group verbal and written instruction for methods to conserve energy, plan and organize activities. Instruct on pacing techniques, use of adaptive equipment and posture/positioning to relieve shortness of breath.   Triggers: - Group verbal and written instruction to review types of environmental controls: home humidity, furnaces, filters, dust mite/pet prevention, HEPA vacuums. To discuss weather changes, air quality and the benefits of nasal washing.   Exacerbations: - Group verbal and written instruction to provide: warning signs, infection symptoms, calling MD promptly, preventive modes, and value of vaccinations. Review: effective airway clearance, coughing and/or vibration techniques. Create an Sports administrator.   Oxygen: - Individual and group verbal and written instruction on oxygen therapy. Includes supplement oxygen, available portable oxygen systems, continuous and intermittent flow rates, oxygen safety, concentrators, and Medicare reimbursement for oxygen. Flowsheet Row Pulmonary Rehab from 08/28/2016 in [facility] Cardiac and Pulmonary Rehab  Date  08/20/16  Educator  LB  Instruction Review Code  2- meets goals/outcomes      Respiratory Medications: - Group verbal and written instruction to review medications for lung disease. Drug class, frequency, complications, importance of spacers, rinsing mouth after steroid MDI's, and proper cleaning methods for nebulizers.   AED/CPR: - Group verbal and written instruction with the use of models to demonstrate the basic use of the AED with the basic ABC's of resuscitation.   Breathing Retraining: - Provides individuals verbal and written instruction on purpose, frequency, and proper technique of diaphragmatic breathing and pursed-lipped breathing. Applies individual practice skills. Flowsheet Row Pulmonary Rehab from 08/28/2016 in [facility] Cardiac and Pulmonary Rehab  Date  08/26/16  Educator  AS  Instruction Review Code  2- meets goals/outcomes       Anatomy and Physiology of the Lungs: - Group verbal and written instruction with the use of models to provide basic lung anatomy and physiology related to function, structure and complications of lung disease.   Heart Failure: - Group verbal and written instruction on the basics of heart failure: signs/symptoms, treatments, explanation of ejection fraction, enlarged heart and cardiomyopathy.   Sleep Apnea: - Individual verbal and written instruction to review Obstructive Sleep Apnea. Review of risk  factors, methods for diagnosing and types of masks and machines for OSA.   Anxiety: - Provides group, verbal and written instruction on the correlation between heart/lung disease and anxiety, treatment options, and management of anxiety.   Relaxation: - Provides group, verbal and written instruction about the benefits of relaxation for patients with heart/lung disease. Also provides patients with examples of relaxation techniques.   Knowledge Questionnaire Score:     Knowledge Questionnaire Score - 08/20/16 1143      Knowledge Questionnaire Score   Pre Score 9/10       Core Components/Risk Factors/Patient Goals at Admission:     Personal Goals and Risk Factors at Admission - 08/20/16 1146      Core Components/Risk Factors/Patient Goals on Admission    Weight Management Yes   Intervention Weight Management: Develop a combined nutrition and exercise program designed to reach desired caloric intake, while maintaining appropriate intake of nutrient and fiber, sodium and fats, and appropriate energy expenditure required for the weight goal.;Weight Management: Provide education and appropriate resources to help participant work on and attain dietary goals.   Admit Weight 186 lb (84.4 kg)   Goal Weight: Short Term 180 lb (81.6 kg)   Goal Weight: Long Term 163 lb (73.9 kg)   Expected Outcomes Short Term: Continue to assess and modify interventions until short term weight is  achieved;Long Term: Adherence to nutrition and physical activity/exercise program aimed toward attainment of established weight goal;Weight Loss: Understanding of general recommendations for a balanced deficit meal plan, which promotes 1-2 lb weight loss per week and includes a negative energy balance of [phone] kcal/d;Weight Maintenance: Understanding of the daily nutrition guidelines, which includes 25-35% calories from fat, 7% or less cal from saturated fats, less than 232m cholesterol, less than 1.5gm of sodium, & 5 or more servings of fruits and vegetables daily;Understanding recommendations for meals to include 15-35% energy as protein, 25-35% energy from fat, 35-60% energy from carbohydrates, less than 2051mof dietary cholesterol, 20-35 gm of total fiber daily;Understanding of distribution of calorie intake throughout the day with the consumption of 4-5 meals/snacks   Improve shortness of breath with ADL's Yes   Intervention Provide education, individualized exercise plan and daily activity instruction to help decrease symptoms of SOB with activities of daily living.   Expected Outcomes Short Term: Achieves a reduction of symptoms when performing activities of daily living.   Develop more efficient breathing techniques such as purse lipped breathing and diaphragmatic breathing; and practicing self-pacing with activity Yes   Intervention Provide education, demonstration and support about specific breathing techniuqes utilized for more efficient breathing. Include techniques such as pursed lipped breathing, diaphragmatic breathing and self-pacing activity.   Expected Outcomes Short Term: Participant will be able to demonstrate and use breathing techniques as needed throughout daily activities.   Increase knowledge of respiratory medications and ability to use respiratory devices properly  Yes  6-10 liters/min   Intervention Provide education and demonstration as needed of appropriate use of  medications, inhalers, and oxygen therapy.   Expected Outcomes Short Term: Achieves understanding of medications use. Understands that oxygen is a medication prescribed by physician. Demonstrates appropriate use of inhaler and oxygen therapy.   Heart Failure Yes   Intervention Provide a combined exercise and nutrition program that is supplemented with education, support and counseling about heart failure. Directed toward relieving symptoms such as shortness of breath, decreased exercise tolerance, and extremity edema.   Expected Outcomes Improve functional capacity of life;Short term: Attendance in program  2-3 days a week with increased exercise capacity. Reported lower sodium intake. Reported increased fruit and vegetable intake. Reports medication compliance.;Short term: Daily weights obtained and reported for increase. Utilizing diuretic protocols set by physician.;Long term: Adoption of self-care skills and reduction of barriers for early signs and symptoms recognition and intervention leading to self-care maintenance.      Core Components/Risk Factors/Patient Goals Review:      Goals and Risk Factor Review    Row Name 08/26/16 1128             Core Components/Risk Factors/Patient Goals Review   Personal Goals Review Develop more efficient breathing techniques such as purse lipped breathing and diaphragmatic breathing and practicing self-pacing with activity.       Review Pursed lip breathing was discussed and demonstrated with the patient. The patient showed understanding of how and when to use this breathing technique to help with shortness of breath and oxygen saturations.        Expected Outcomes Ms. Valbuena will use pursed lip breathing during exercise and ADL to help control SOB and maintain acceptable oxygen saturations.           Core Components/Risk Factors/Patient Goals at Discharge (Final Review):      Goals and Risk Factor Review - 08/26/16 1128      Core Components/Risk  Factors/Patient Goals Review   Personal Goals Review Develop more efficient breathing techniques such as purse lipped breathing and diaphragmatic breathing and practicing self-pacing with activity.   Review Pursed lip breathing was discussed and demonstrated with the patient. The patient showed understanding of how and when to use this breathing technique to help with shortness of breath and oxygen saturations.    Expected Outcomes Ms. [place] will use pursed lip breathing during exercise and ADL to help control SOB and maintain acceptable oxygen saturations.       ITP Comments:   Comments: 30 Day Note Review

## 2016-09-04 ENCOUNTER — Telehealth: Payer: Self-pay | Admitting: Respiratory Therapy

## 2016-09-04 ENCOUNTER — Encounter: Payer: Self-pay | Admitting: Respiratory Therapy

## 2016-09-04 DIAGNOSIS — J8410 Pulmonary fibrosis, unspecified: Secondary | ICD-10-CM

## 2016-09-04 NOTE — Telephone Encounter (Signed)
Ms Loreta AveMann called out Monday and Wednesday from [facility]. She has a cold, but plans to come back Friday

## 2016-09-08 ENCOUNTER — Other Ambulatory Visit: Payer: Self-pay | Admitting: Physician Assistant

## 2016-09-08 DIAGNOSIS — E559 Vitamin D deficiency, unspecified: Secondary | ICD-10-CM

## 2016-09-09 ENCOUNTER — Encounter: Payer: 59 | Admitting: *Deleted

## 2016-09-09 DIAGNOSIS — J8410 Pulmonary fibrosis, unspecified: Secondary | ICD-10-CM

## 2016-09-09 DIAGNOSIS — J849 Interstitial pulmonary disease, unspecified: Secondary | ICD-10-CM | POA: Diagnosis not present

## 2016-09-09 NOTE — Telephone Encounter (Signed)
Last ov 01/30/16 Last filled 12/06/15 Please review. Thank you. sd

## 2016-09-09 NOTE — Progress Notes (Signed)
Daily Session Note  Patient Details  Name: SADAE ARRAZOLA MRN: [id number] Date of Birth: July 26, 1965 Referring Provider:     Pulmonary Rehab from 08/20/2016 in [facility] Cardiac and Pulmonary Rehab  Referring Provider  Farley Ly      Encounter Date: 09/09/2016  Check In:     Session Check In - 09/09/16 1118      Check-In   Location [facility]-Cardiac & Pulmonary Rehab   Staff Present Earlean Shawl, BS, ACSM CEP, Exercise Physiologist;Amanda Oletta Darter, BA, ACSM CEP, Exercise Physiologist;Laureen Owens Shark, BS, RRT, Respiratory Therapist   Supervising physician immediately available to respond to emergencies [facility] immediately available ER MD   Physician(s) Reita Cliche and Alfred Levins   Medication changes reported     No   Fall or balance concerns reported    No   Warm-up and Cool-down Performed as group-led Location manager Performed Yes   VAD Patient? No     Pain Assessment   Currently in Pain? No/denies   Multiple Pain Sites No         History  Smoking Status  . Never Smoker  Smokeless Tobacco  . Never Used    Goals Met:  Proper associated with RPD/PD & O2 Sat Independence with exercise equipment Exercise tolerated well Strength training completed today  Goals Unmet:  Not Applicable  Comments: Pt able to follow exercise prescription today without complaint.  Will continue to monitor for progression.    Dr. Emily Filbert is Medical Director for [place] and [facility].

## 2016-09-11 ENCOUNTER — Telehealth: Payer: Self-pay

## 2016-09-11 NOTE — Telephone Encounter (Signed)
[place] called to say she would not be at class today.

## 2016-09-13 ENCOUNTER — Encounter: Payer: 59 | Admitting: *Deleted

## 2016-09-13 DIAGNOSIS — J849 Interstitial pulmonary disease, unspecified: Secondary | ICD-10-CM | POA: Diagnosis not present

## 2016-09-13 DIAGNOSIS — J8410 Pulmonary fibrosis, unspecified: Secondary | ICD-10-CM

## 2016-09-13 NOTE — Progress Notes (Signed)
Daily Session Note  Patient Details  Name: Shelby Graham MRN: [id number] Date of Birth: Oct 15, 1965 Referring Provider:     Pulmonary Rehab from 08/20/2016 in [facility] Cardiac and Pulmonary Rehab  Referring Provider  Farley Ly      Encounter Date: 09/13/2016  Check In:     Session Check In - 09/13/16 1042      Check-In   Location [facility]-Cardiac & Pulmonary Rehab   Staff Present Nyoka Cowden, RN, BSN, [place], BS, RRT, Respiratory Therapist;Other  Darel Hong, RN BSN   Supervising physician immediately available to respond to emergencies [facility] immediately available ER MD   Physician(s) Drs. Alfred Levins and [place]   Medication changes reported     No   Fall or balance concerns reported    No   Tobacco Cessation No Change   Warm-up and Cool-down Performed as group-led Location manager Performed Yes   VAD Patient? No     Pain Assessment   Currently in Pain? No/denies   Multiple Pain Sites No         History  Smoking Status  . Never Smoker  Smokeless Tobacco  . Never Used    Goals Met:  Proper associated with RPD/PD & O2 Sat Independence with exercise equipment Using PLB without cueing & demonstrates good technique Exercise tolerated well Strength training completed today  Goals Unmet:  Not Applicable  Comments: Pt able to follow exercise prescription today without complaint.  Will continue to monitor for progression.    Dr. Emily Filbert is Medical Director for [place] and [facility].

## 2016-09-16 ENCOUNTER — Encounter: Payer: 59 | Admitting: *Deleted

## 2016-09-16 DIAGNOSIS — J8410 Pulmonary fibrosis, unspecified: Secondary | ICD-10-CM

## 2016-09-16 DIAGNOSIS — J849 Interstitial pulmonary disease, unspecified: Secondary | ICD-10-CM | POA: Diagnosis not present

## 2016-09-16 NOTE — Progress Notes (Signed)
Daily Session Note  Patient Details  Name: Shelby Graham MRN: [id number] Date of Birth: 1966-03-29 Referring Provider:     Pulmonary Rehab from 08/20/2016 in [facility] Cardiac and Pulmonary Rehab  Referring Provider  Farley Ly      Encounter Date: 09/16/2016  Check In:     Session Check In - 09/16/16 1131      Check-In   Location [facility]-Cardiac & Pulmonary Rehab   Staff Present Nada Maclachlan, BA, ACSM CEP, Exercise Physiologist;Laureen Owens Shark, BS, RRT, Respiratory Bertis Ruddy, BS, ACSM CEP, Exercise Physiologist   Supervising physician immediately available to respond to emergencies [facility] immediately available ER MD   Physician(s) Jonetta Speak and Jimmye Norman    Medication changes reported     No   Fall or balance concerns reported    No   Warm-up and Cool-down Performed as group-led instruction   Resistance Training Performed Yes   VAD Patient? No     Pain Assessment   Currently in Pain? No/denies   Multiple Pain Sites No         History  Smoking Status  . Never Smoker  Smokeless Tobacco  . Never Used    Goals Met:  Proper associated with RPD/PD & O2 Sat Independence with exercise equipment Exercise tolerated well Strength training completed today  Goals Unmet:  Not Applicable  Comments: Pt able to follow exercise prescription today without complaint.  Will continue to monitor for progression.    Dr. Emily Filbert is Medical Director for [place] and [facility].

## 2016-09-18 DIAGNOSIS — J849 Interstitial pulmonary disease, unspecified: Secondary | ICD-10-CM | POA: Diagnosis not present

## 2016-09-18 DIAGNOSIS — J8410 Pulmonary fibrosis, unspecified: Secondary | ICD-10-CM

## 2016-09-18 NOTE — Progress Notes (Signed)
Daily Session Note  Patient Details  Name: Shelby Graham MRN: [id number] Date of Birth: January 25, 1966 Referring Provider:     Pulmonary Rehab from 08/20/2016 in [facility] Cardiac and Pulmonary Rehab  Referring Provider  Farley Ly      Encounter Date: 09/18/2016  Check In:     Session Check In - 09/18/16 1235      Check-In   Location [facility]-Cardiac & Pulmonary Rehab   Staff Present Carson Myrtle, BS, RRT, Respiratory Lennie Hummer, MA, ACSM RCEP, Exercise Physiologist;AmeLie Hollars Oletta Darter, BA, ACSM CEP, Exercise Physiologist   Supervising physician immediately available to respond to emergencies [facility] immediately available ER MD   Physician(s) Kerman Passey and Jimmye Norman   Medication changes reported     No   Fall or balance concerns reported    No   Warm-up and Cool-down Performed as group-led instruction   Resistance Training Performed Yes   VAD Patient? No     Pain Assessment   Currently in Pain? No/denies           Exercise Prescription Changes - 09/18/16 1200      Response to Exercise   Blood Pressure (Admit) 98/54   Blood Pressure (Exit) 108/60   Heart Rate (Admit) 59 bpm   Heart Rate (Exercise) 77 bpm   Heart Rate (Exit) 59 bpm   Oxygen Saturation (Admit) 98 %   Oxygen Saturation (Exercise) 88 %   Oxygen Saturation (Exit) 92 %   Rating of Perceived Exertion (Exercise) 12   Perceived Dyspnea (Exercise) 2   Duration Continue with 45 min of aerobic exercise without signs/symptoms of physical distress.   Intensity THRR unchanged     Progression   Progression Continue to progress workloads to maintain intensity without signs/symptoms of physical distress.     Resistance Training   Training Prescription Yes   Weight 3   Reps 10-15     Oxygen   Oxygen Continuous   Liters 6-10     Treadmill   MPH 1.8   Grade 0   Minutes 15   METs 2.38     NuStep   Level 2   SPM 61   Minutes 15   METs 1.5     REL-XR   Level 2   Minutes 15   METs 2.1     Home  Exercise Plan   Plans to continue exercise at Home (comment)  possibly walk at the mall also   Frequency Add 1 additional day to program exercise sessions.   Initial Home Exercises Provided 09/18/16      History  Smoking Status  . Never Smoker  Smokeless Tobacco  . Never Used    Goals Met:  Proper associated with RPD/PD & O2 Sat Independence with exercise equipment Exercise tolerated well Strength training completed today  Goals Unmet:  Not Applicable  Comments: Reviewed home exercise with pt today.  Pt plans to walk and stretch for exercise.  Reviewed THR, pulse, RPE, sign and symptoms, NTG use, and when to call 911 or MD.  Also discussed weather considerations and indoor options.  Pt voiced understanding.    Dr. Emily Filbert is Medical Director for [place] and [facility].

## 2016-09-20 DIAGNOSIS — J8410 Pulmonary fibrosis, unspecified: Secondary | ICD-10-CM

## 2016-09-20 DIAGNOSIS — J849 Interstitial pulmonary disease, unspecified: Secondary | ICD-10-CM | POA: Diagnosis not present

## 2016-09-20 NOTE — Progress Notes (Signed)
Daily Session Note  Patient Details  Name: MALLEY HAUTER MRN: [id number] Date of Birth: 06/12/1966 Referring Provider:     Pulmonary Rehab from 08/20/2016 in [facility] Cardiac and Pulmonary Rehab  Referring Provider  Farley Ly      Encounter Date: 09/20/2016  Check In:     Session Check In - 09/20/16 1032      Check-In   Location [facility]-Cardiac & Pulmonary Rehab   Staff Present Nada Maclachlan, BA, ACSM CEP, Exercise Physiologist;Mary Kellie Shropshire, RN, BSN, MA   Supervising physician immediately available to respond to emergencies [facility] immediately available ER MD   Physician(s) Drs. Malinda and Lord   Medication changes reported     No   Fall or balance concerns reported    No   Warm-up and Cool-down Performed as group-led Location manager Performed Yes   VAD Patient? No     Pain Assessment   Currently in Pain? No/denies   Multiple Pain Sites No         History  Smoking Status  . Never Smoker  Smokeless Tobacco  . Never Used    Goals Met:  Proper associated with RPD/PD & O2 Sat Independence with exercise equipment Exercise tolerated well Strength training completed today  Goals Unmet:  Not Applicable  Comments: Pt able to follow exercise prescription today without complaint.  Will continue to monitor for progression.    Dr. Emily Filbert is Medical Director for [place] and [facility].

## 2016-09-23 ENCOUNTER — Encounter: Payer: 59 | Attending: Cardiovascular Disease

## 2016-09-23 DIAGNOSIS — J849 Interstitial pulmonary disease, unspecified: Secondary | ICD-10-CM | POA: Insufficient documentation

## 2016-09-23 DIAGNOSIS — I5022 Chronic systolic (congestive) heart failure: Secondary | ICD-10-CM | POA: Insufficient documentation

## 2016-09-27 ENCOUNTER — Encounter: Payer: 59 | Admitting: *Deleted

## 2016-09-27 DIAGNOSIS — I5022 Chronic systolic (congestive) heart failure: Secondary | ICD-10-CM | POA: Diagnosis not present

## 2016-09-27 DIAGNOSIS — J849 Interstitial pulmonary disease, unspecified: Secondary | ICD-10-CM | POA: Diagnosis present

## 2016-09-27 DIAGNOSIS — J8410 Pulmonary fibrosis, unspecified: Secondary | ICD-10-CM

## 2016-09-27 NOTE — Progress Notes (Signed)
Daily Session Note  Patient Details  Name: SHARLET NOTARO MRN: [id number] Date of Birth: Dec 11, 1965 Referring Provider:     Pulmonary Rehab from 08/20/2016 in [facility] Cardiac and Pulmonary Rehab  Referring Provider  Farley Ly      Encounter Date: 09/27/2016  Check In:     Session Check In - 09/27/16 1123      Check-In   Location [facility]-Cardiac & Pulmonary Rehab   Staff Present Gerlene Burdock, RN, Vickki Hearing, BA, ACSM CEP, Exercise Physiologist;Other  Darel Hong, RN   Supervising physician immediately available to respond to emergencies [facility] immediately available ER MD   Physician(s) Dr. Corky Downs and Dr. Mable Paris   Medication changes reported     No   Fall or balance concerns reported    No   Warm-up and Cool-down Performed on first and last piece of equipment   Resistance Training Performed Yes   VAD Patient? No     Pain Assessment   Currently in Pain? No/denies         History  Smoking Status  . Never Smoker  Smokeless Tobacco  . Never Used    Goals Met:  Proper associated with RPD/PD & O2 Sat Exercise tolerated well  Goals Unmet:  Not Applicable  Comments:     Dr. Emily Filbert is Medical Director for [place] and [facility].

## 2016-09-30 ENCOUNTER — Encounter: Payer: 59 | Admitting: *Deleted

## 2016-09-30 ENCOUNTER — Encounter: Payer: Self-pay | Admitting: Respiratory Therapy

## 2016-09-30 DIAGNOSIS — J8410 Pulmonary fibrosis, unspecified: Secondary | ICD-10-CM

## 2016-09-30 DIAGNOSIS — J849 Interstitial pulmonary disease, unspecified: Secondary | ICD-10-CM | POA: Diagnosis not present

## 2016-09-30 NOTE — Progress Notes (Signed)
Pulmonary Individual Treatment Plan  Patient Details  Name: Shelby Graham MRN: [id number] Date of Birth: 1965/09/01 Referring Provider:     Pulmonary Rehab from 08/20/2016 in [facility] Cardiac and Pulmonary Rehab  Referring Provider  Farley Ly      Initial Encounter Date:    Pulmonary Rehab from 08/20/2016 in [facility] Cardiac and Pulmonary Rehab  Date  08/20/16  Referring Provider  Farley Ly      Visit Diagnosis: Pulmonary fibrosis, unspecified ([place])  Patient's Home Medications on Admission:  Current Outpatient Prescriptions:    amoxicillin-clavulanate (AUGMENTIN) 875-125 MG tablet, Take 1 tablet by mouth 2 (two) times daily., Disp: 20 tablet, Rfl: 0   azithromycin (ZITHROMAX) 250 MG tablet, Take 1 tablet (250 mg total) by mouth daily., Disp: 30 tablet, Rfl: 0   esomeprazole (NEXIUM) 40 MG capsule, , Disp: , Rfl:    levothyroxine (SYNTHROID, LEVOTHROID) 50 MCG tablet, TAKE 1 TABLET (50 MCG TOTAL) BY MOUTH DAILY BEFORE BREAKFAST., Disp: 30 tablet, Rfl: 5   lisinopril (PRINIVIL,ZESTRIL) 5 MG tablet, Take 5 mg by mouth., Disp: , Rfl:    metoprolol succinate (TOPROL-XL) 25 MG 24 hr tablet, Take 75 mg by mouth., Disp: , Rfl:    mupirocin ointment (BACTROBAN) 2 %, Place 1 application into the nose 2 (two) times daily., Disp: 22 g, Rfl: 0   mycophenolate (CELLCEPT) 500 MG tablet, Take by mouth. , Disp: , Rfl:    ondansetron (ZOFRAN) 4 MG tablet, Take 1 tablet (4 mg total) by mouth every 8 (eight) hours as needed for nausea or vomiting., Disp: 20 tablet, Rfl: 0   ranitidine (ZANTAC) 150 MG capsule, Take by mouth., Disp: , Rfl:    sertraline (ZOLOFT) 50 MG tablet, Take 50 mg by mouth., Disp: , Rfl:    Vitamin D, Ergocalciferol, (DRISDOL) 50000 units CAPS capsule, TAKE 1 CAPSULE (50,000 UNITS TOTAL) BY MOUTH EVERY 7 (SEVEN) DAYS., Disp: 4 capsule, Rfl: 6   zolpidem (AMBIEN) 5 MG tablet, Take 5 mg by mouth., Disp: , Rfl:   Past Medical History: Past Medical History:  Diagnosis Date   Allergic  rhinitis    Anemia    Depression    History of torn meniscus of left knee 2005   surgical repair of meniscus   Hyperlipidemia    Lung disease    NSIP (nonspecific interstitial pneumonia) (HCC)    Postinflammatory pulmonary fibrosis (HCC)    Shortness of breath dyspnea    Vitamin D deficiency     Tobacco Use: History  Smoking Status   Never Smoker  Smokeless Tobacco   Never Used    Labs: Recent Review Flowsheet Data    Labs for ITP Cardiac and Pulmonary Rehab Latest Ref Rng & Units 12/05/2015   Cholestrol 100 - 199 mg/dL 206(H)   LDLCALC 0 - 99 mg/dL 133(H)   HDL >39 mg/dL 47   Trlycerides 0 - 149 mg/dL 128       ADL UCSD:     Pulmonary Assessment Scores    Row Name 08/20/16 1143         ADL UCSD   ADL Phase Entry     SOB Score total 95     Rest 1     Walk 3     Stairs 5     Bath 5     Dress 4     Shop 4        Pulmonary Function Assessment:     Pulmonary Function Assessment - 08/26/16 0617  Pulmonary Function Tests   FVC% 41.3 %   FEV1% 50.9 %   FEV1/FVC Ratio 98.14   DLCO% 23.1 %     Initial Spirometry Results   Comments Test date      Exercise Target Goals:    Exercise Program Goal: Individual exercise prescription set with THRR, safety & activity barriers. Participant demonstrates ability to understand and report RPE using BORG scale, to self-measure pulse accurately, and to acknowledge the importance of the exercise prescription.  Exercise Prescription Goal: Starting with aerobic activity 30 plus minutes a day, 3 days per week for initial exercise prescription. Provide home exercise prescription and guidelines that participant acknowledges understanding prior to discharge.  Activity Barriers & Risk Stratification:   6 Minute Walk:     6 Minute Walk    Row Name 08/20/16 1211         6 Minute Walk   Distance 825 feet     Walk Time 4.75 minutes     # of Rest Breaks 1     MPH 1.97     METS 2.85     RPE 13      Perceived Dyspnea  3     VO2 Peak 9.99     Symptoms Yes (comment)     Comments O2 sats dropped to 84 at 2:29 - she stopped until it reached 88 and then continued until 6 min was achieved.     Resting HR 56 bpm     Resting BP 104/60     Max Ex. HR 102 bpm     Max Ex. BP 134/74       Interval HR   Baseline HR 56     2 Minute HR 102     5 Minute HR 91     6 Minute HR 98     2 Minute Post HR 65     Interval Heart Rate? Yes       Interval Oxygen   Interval Oxygen? Yes     Baseline Oxygen Saturation % 99 %     Baseline Liters of Oxygen 10 L     1 Minute Oxygen Saturation % 90 %     1 Minute Liters of Oxygen 10 L     2 Minute Oxygen Saturation % 87 %     2 Minute Liters of Oxygen 10 L     3 Minute Liters of Oxygen 10 L     4 Minute Oxygen Saturation % 88 %     4 Minute Liters of Oxygen 10 L     5 Minute Oxygen Saturation % 91 %     5 Minute Liters of Oxygen 10 L     6 Minute Oxygen Saturation % 84 %     6 Minute Liters of Oxygen 10 L     2 Minute Post Oxygen Saturation % 88 %     2 Minute Post Liters of Oxygen 10 L       Oxygen Initial Assessment:     Oxygen Initial Assessment - 08/28/16 1502      Home Oxygen   Home Oxygen Device E-Tanks   Sleep Oxygen Prescription Continuous   Liters per minute 6   Home Exercise Oxygen Prescription Continuous   Liters per minute --  6-10l/m   Home at Rest Exercise Oxygen Prescription Continuous  6-10l/m; uses simple mask with some activity   Compliance with Home Oxygen Use Yes     Initial 6 min Walk  Oxygen Used Continuous   Liters per minute 10   Resting Oxygen Saturation  during 6 min walk 99 %   Exercise Oxygen Saturation  during 6 min walk 84 %     Program Oxygen Prescription   Program Oxygen Prescription Continuous;E-Tanks   Liters per minute 8   Comments On treadmill, VentiMask on 55% at 15l/m     Intervention   Short Term Goals To learn and exhibit compliance with exercise, home and travel O2 prescription;To learn and  understand importance of monitoring SPO2 with pulse oximeter and demonstrate accurate use of the pulse oximeter.;To Learn and understand importance of maintaining oxygen saturations>88%;To learn and demonstrate proper purse lipped breathing techniques or other breathing techniques.   Long  Term Goals Exhibits compliance with exercise, home and travel O2 prescription;Verbalizes importance of monitoring SPO2 with pulse oximeter and return demonstration;Maintenance of O2 saturations>88%;Exhibits proper breathing techniques, such as purse lipped breathing or other method taught during program session      Oxygen Re-Evaluation:   Oxygen Discharge (Final Oxygen Re-Evaluation):   Initial Exercise Prescription:     Initial Exercise Prescription - 08/20/16 1200      Date of Initial Exercise RX and Referring Provider   Date 08/20/16   Referring Provider Farley Ly     Oxygen   Oxygen Continuous   Liters 6-10     Treadmill   MPH 1.8   Grade 0   Minutes 15   METs 2.38     NuStep   Level 2   SPM 80   Minutes 15   METs 2.4     REL-XR   Level 2   Speed 60   Minutes 15   METs 2.4     Prescription Details   Frequency (times per week) 3   Duration Progress to 45 minutes of aerobic exercise without signs/symptoms of physical distress     Intensity   THRR 40-80% of Max Heartrate 102-148   Ratings of Perceived Exertion 11-15   Perceived Dyspnea 0-4     Resistance Training   Training Prescription Yes   Weight 3   Reps 10-15      Perform Capillary Blood Glucose checks as needed.  Exercise Prescription Changes:     Exercise Prescription Changes    Row Name 08/26/16 1100 09/06/16 1100 09/18/16 1200         Response to Exercise   Blood Pressure (Admit) 104/60 102/56 98/54     Blood Pressure (Exercise) 118/66 98/50  --     Blood Pressure (Exit) 107/64 94/52 108/60     Heart Rate (Admit) 60 bpm 45 bpm 59 bpm     Heart Rate (Exercise) 87 bpm 69 bpm 77 bpm     Heart Rate  (Exit) 50 bpm 43 bpm 59 bpm     Oxygen Saturation (Admit) 93 % 94 % 98 %     Oxygen Saturation (Exercise) 90 % 93 % 88 %     Oxygen Saturation (Exit) 99 % 92 % 92 %     Rating of Perceived Exertion (Exercise) '13 13 12     ' Perceived Dyspnea (Exercise) '3 2 2     ' Duration  -- Progress to 45 minutes of aerobic exercise without signs/symptoms of physical distress Continue with 45 min of aerobic exercise without signs/symptoms of physical distress.     Intensity  -- THRR unchanged THRR unchanged       Progression   Progression  -- Continue to progress workloads to maintain  intensity without signs/symptoms of physical distress. Continue to progress workloads to maintain intensity without signs/symptoms of physical distress.       Resistance Training   Training Prescription Yes Yes Yes     Weight '3 3 3     ' Reps 10-15 10-15 10-15       Oxygen   Oxygen Continuous Continuous Continuous     Liters 6-10 6-10 6-10       Treadmill   MPH 1.8  -- 1.8     Grade 0  -- 0     Minutes 15  -- 15     METs 2.38  -- 2.38       NuStep   Level '2 2 2     ' SPM 80 84 61     Minutes '15 15 15     ' METs 2.4 2.2 1.5       REL-XR   Level '2 2 2     ' Speed 60  --  --     Minutes '15 15 15     ' METs 2.4  -- 2.1       Home Exercise Plan   Plans to continue exercise at  --  -- Home (comment)  possibly walk at the mall also     Frequency  --  -- Add 1 additional day to program exercise sessions.     Initial Home Exercises Provided  --  -- 09/18/16        Exercise Comments:     Exercise Comments    Row Name 08/26/16 1134 08/30/16 1131 09/09/16 1120 09/18/16 1239 09/27/16 1132   Exercise Comments First full day of exercise!  Patient was oriented to gym and equipment including functions, settings, policies, and procedures.  Patient's individual exercise prescription and treatment plan were reviewed.  All starting workloads were established based on the results of the 6 minute walk test done at initial orientation  visit.  The plan for exercise progression was also introduced and progression will be customized based on patient's performance and goals Shelby Graham came in with lower than normal BP 102/58 while seated and stated she felt slightly dizzy when standing up. Her metoprolol was increased recently by her cardiologist and has been advised to monitor BP at home and call her cardiologist if continues to be low and symptomatic.  Stated she was more winded than normal but O2 sats were stable at 93% on 6L at rest. She was instructed no to get on the treadmill today and she only did seated exercise and was given water to drink. Her Oxygen demand during exercise increased to 8L and then 10L to maintain O2sat> or = 90%. Her blood pressure dropped slightly to 98/56 but remained stable for the duration of her exercise. Final BP after exercise was 94/52 and O2 sats maintained >90%.  Ms. Heckert switched from the Bio-Step to the Nu-Step today due to equipment avalability today. She tolerated the new equipment well.   Reviewed home exercise with pt today.  Pt plans to walk and stretch for exercise.  Reviewed THR, pulse, RPE, sign and symptoms, NTG use, and when to call 911 or MD.  Also discussed weather considerations and indoor options.  Pt voiced understanding.    I had her not do the Treadmill today since she felt so short of breath due to the high pollen count.       Exercise Goals and Review:   Exercise Goals Re-Evaluation :   Discharge Exercise Prescription (Final Exercise  Prescription Changes):     Exercise Prescription Changes - 09/18/16 1200      Response to Exercise   Blood Pressure (Admit) 98/54   Blood Pressure (Exit) 108/60   Heart Rate (Admit) 59 bpm   Heart Rate (Exercise) 77 bpm   Heart Rate (Exit) 59 bpm   Oxygen Saturation (Admit) 98 %   Oxygen Saturation (Exercise) 88 %   Oxygen Saturation (Exit) 92 %   Rating of Perceived Exertion (Exercise) 12   Perceived Dyspnea (Exercise) 2   Duration Continue  with 45 min of aerobic exercise without signs/symptoms of physical distress.   Intensity THRR unchanged     Progression   Progression Continue to progress workloads to maintain intensity without signs/symptoms of physical distress.     Resistance Training   Training Prescription Yes   Weight 3   Reps 10-15     Oxygen   Oxygen Continuous   Liters 6-10     Treadmill   MPH 1.8   Grade 0   Minutes 15   METs 2.38     NuStep   Level 2   SPM 61   Minutes 15   METs 1.5     REL-XR   Level 2   Minutes 15   METs 2.1     Home Exercise Plan   Plans to continue exercise at Home (comment)  possibly walk at the mall also   Frequency Add 1 additional day to program exercise sessions.   Initial Home Exercises Provided 09/18/16      Nutrition:  Target Goals: Understanding of nutrition guidelines, daily intake of sodium <1563m, cholesterol <2044m calories 30% from fat and 7% or less from saturated fats, daily to have 5 or more servings of fruits and vegetables.  Biometrics:     Pre Biometrics - 08/20/16 1211      Pre Biometrics   Height 5' 2.5" (1.588 m)   Weight 187 lb 8 oz (85 kg)   Waist Circumference 34.5 inches   Hip Circumference 49 inches   Waist to Hip Ratio 0.7 %   BMI (Calculated) 33.8       Nutrition Therapy Plan and Nutrition Goals:   Nutrition Discharge: Rate Your Plate Scores:   Nutrition Goals Re-Evaluation:   Nutrition Goals Discharge (Final Nutrition Goals Re-Evaluation):   Psychosocial: Target Goals: Acknowledge presence or absence of significant depression and/or stress, maximize coping skills, provide positive support system. Participant is able to verbalize types and ability to use techniques and skills needed for reducing stress and depression.   Initial Review & Psychosocial Screening:     Initial Psych Review & Screening - 08/20/16 [place]   Comments Ms MaJudys preparing for a Lung  Transplant. [facility] daughter [facility] be [facility]akers throught [facility]. She is looking forward to [facility] - having supervised exercise.     Screening Interventions   Interventions Encouraged to exercise      Quality of Life Scores:     Quality of Life - 08/20/16 1152      Quality of Life Scores   Health/Function Pre 20.81 %   Socioeconomic Pre 20.38 %   Psych/Spiritual Pre 20.43 %   Family Pre 21 %   GLOBAL Pre 20.67 %      PHQ-9: Recent Review Flowsheet Data    [facility]  Decreased Interest 1   Down, Depressed, Hopeless 1   PHQ - 2 Score 2   Altered sleeping 3   Tired, decreased energy 1   Change in appetite 0   Feeling bad or failure about yourself  1   Trouble concentrating 0   Moving slowly or fidgety/restless 0   Suicidal thoughts 0   PHQ-9 Score 7   Difficult doing work/chores Very difficult     Interpretation of Total Score  Total Score Depression Severity:  1-4 = Minimal depression, 5-9 = Mild depression, 10-14 = Moderate depression, 15-19 = Moderately severe depression, 20-27 = Severe depression   Psychosocial Evaluation and Intervention:     Psychosocial Evaluation - 08/28/16 1108      Psychosocial Evaluation & Interventions   Interventions Encouraged to exercise with the program and follow exercise prescription;Stress management education;Relaxation education   Comments Counselor met today with Ms. Marva Panda") for initial psychosocial evaluation.  She is a 51 year old who has NSIP (Non-specific institutial pneumonia) and is on the transplant list for a double lung transplant.  She has a strong support system living with her daugher and her son, sisters and mother are close by.  Shelby Graham is also actively involved in her local church.  She reports not sleeping well (3-4 hours typically per night) and has Ambien PRN to help with this; which is successful  occasionally.  Shelby Graham has a history of depression and anxiety and is seeing a Transport planner and a psychiatrist to manage this.  She takes medication for her anxiety primarily and states that it helps.  She is out on disability and feels isolated at times with carrying an oxygen tank around with her everywhere.  She states she is typically in a good mood, but does feel more irritable lately.  Shelby Graham has goals to increase her stamina and strength; to eat better; and to improve her mood and feelings of isolation by the socialization aspects of this program.  She will benefit from the psychoeducational components of this program as well.     Expected Outcomes Shelby Graham will consistently exercise; she will be educated on healthier eating and hopefully lose weight as a result; [name] hopes to improve her sleep and mood with sleep as well.  her ultimate goal is to be strong enough to get the double lung transplants.    Continue Psychosocial Services  Follow up required by staff      Psychosocial Re-Evaluation:     Psychosocial Re-Evaluation    [place] Name 09/16/16 1132 09/18/16 1029 09/27/16 1129         Psychosocial Re-Evaluation   Current issues with Current Anxiety/Panic;Current Stress Concerns;Current Sleep Concerns  --  --     Comments Counselor follow up with Shelby Graham today who came in late and tearful.  Counselor provided supportive services and assessed her current mental health symptoms.   Shelby Graham is reporting increased anxiety; irritability; lack of motivation; more isolation; ongoing sleep problems and use of food to treat these symptoms.   Counselor provided CBT and use of thought stopping and thought restructuring with Shelby Graham to help her with these thoughts and feelings.  Counselor also encouraged her to contact her psychiatrist to consider increasing her current medications to help during this period of increased anxiety and depressive symptoms.  Counselor encouraged her to make a list of positive things to try to combat the  negative thoughts and work to "choose" to focus on the positive as much as possible currently.  Counselor will  monitor these symptoms in [place] over the next few weeks.  Counselor also encouraged her to speak with her family honestly about these challenges and how to help her during this time.   Brief follow up with Shelby Graham today stating she contacted her psychiatrist who increased her medications to 75 mg vs. 50 of Zoloft.  She showed counselor a list of positive things she is working on to change her focus to the positive vs. negative in her life currently.  Shelby Graham was doing a little better today and should pick the medication up after class and begin tonight.  Counselor provided her with a therapist contact information to begin working with if she chooses to change from her current provider.  Counselor will follow with Shelby Graham throughout the course of this program.   Shelby Graham said it was a tough breathing day due to her pollen. Upon arrival walking down to [facility] even on 6 liters of oxgyen her pulse ox was 76% with rest came up to 91% and we turned her supplemental oxgyen to 8 liters. I had her not do the treadmill today and discused volunteers can bring her down in a wheelchair. "Shelby Graham" admits that she starts to panic when she feels she can not catch her breath. I encouraged her to call her [place] and      Expected Outcomes Shelby Graham will contact her Psychiatrist to consider increasing her medications at this time to help with her anxious thoughts and feelings.  Shelby Graham will make a list of positive things to counter the negative thoughts that she is experiencing.  Shelby Graham will attend the stress management; anxiety and relaxation educational components of this program to be educated on other techniqes to use currently.  Counselor will provide a list of alternate therapists for Shelby Graham if things do not improve in the near future.   Shelby Graham will begin the increased dose of medication this evening and will follow with her psychiatrist in a  month as scheduled.  Shelby Graham will continue to choose to focus on positives vs negatives in her life and will contact an alternate therapist if needed to help manage her mood.    --     Interventions Stress management education;Encouraged to attend Pulmonary Rehabilitation for the exercise;Physician referral Therapist referral  --     Continue Psychosocial Services  Follow up required by counselor  --  --        Psychosocial Discharge (Final Psychosocial Re-Evaluation):     Psychosocial Re-Evaluation - 09/27/16 1129      Psychosocial Re-Evaluation   Comments Shelby Graham said it was a tough breathing day due to her pollen. Upon arrival walking down to [facility] even on 6 liters of oxgyen her pulse ox was 76% with rest came up to 91% and we turned her supplemental oxgyen to 8 liters. I had her not do the treadmill today and discused volunteers can bring her down in a wheelchair. "Shelby Graham" admits that she starts to panic when she feels she can not catch her breath. I encouraged her to call her [place] nurse and       Education: Education Goals: Education classes will be provided on a weekly basis, covering required topics. Participant will state understanding/return demonstration of topics presented.  Learning Barriers/Preferences:     Learning Barriers/Preferences - 08/20/16 1143      Learning Barriers/Preferences   Learning Barriers None   Learning Preferences None      Education Topics: Initial Evaluation Education: - Verbal, written  and demonstration of respiratory meds, RPE/PD scales, oximetry and breathing techniques. Instruction on use of nebulizers and MDIs: cleaning and proper use, rinsing mouth with steroid doses and importance of monitoring MDI activations.   Pulmonary Rehab from 09/27/2016 in [facility] Cardiac and Pulmonary Rehab  Date  08/20/16  Educator  LB  Instruction Review Code  2- meets goals/outcomes      General Nutrition Guidelines/Fats and Fiber: -Group instruction  provided by verbal, written material, models and posters to present the general guidelines for heart healthy nutrition. Gives an explanation and review of dietary fats and fiber.   Pulmonary Rehab from 09/27/2016 in [facility] Cardiac and Pulmonary Rehab  Date  08/26/16  Educator  CR  Instruction Review Code  2- meets goals/outcomes      Controlling Sodium/Reading Food Labels: -Group verbal and written material supporting the discussion of sodium use in heart healthy nutrition. Review and explanation with models, verbal and written materials for utilization of the food label.   Pulmonary Rehab from 09/27/2016 in [facility] Cardiac and Pulmonary Rehab  Date  09/09/16  Educator  CR  Instruction Review Code  2- meets goals/outcomes      Exercise Physiology & Risk Factors: - Group verbal and written instruction with models to review the exercise physiology of the cardiovascular system and associated critical values. Details cardiovascular disease risk factors and the goals associated with each risk factor.   Pulmonary Rehab from 09/27/2016 in [facility] Cardiac and Pulmonary Rehab  Date  09/27/16  Educator  Nada Maclachlan, EP   Instruction Review Code  2- meets goals/outcomes      Aerobic Exercise & Resistance Training: - Gives group verbal and written discussion on the health impact of inactivity. On the components of aerobic and resistive training programs and the benefits of this training and how to safely progress through these programs.   Flexibility, Balance, General Exercise Guidelines: - Provides group verbal and written instruction on the benefits of flexibility and balance training programs. Provides general exercise guidelines with specific guidelines to those with heart or lung disease. Demonstration and skill practice provided.   Stress Management: - Provides group verbal and written instruction about the health risks of elevated stress, cause of high stress, and healthy ways to reduce  stress.   Depression: - Provides group verbal and written instruction on the correlation between heart/lung disease and depressed mood, treatment options, and the stigmas associated with seeking treatment.   Pulmonary Rehab from 09/27/2016 in [facility] Cardiac and Pulmonary Rehab  Date  08/28/16  Educator  [facility]  Instruction Review Code  2- meets goals/outcomes      Exercise & Equipment Safety: - Individual verbal instruction and demonstration of equipment use and safety with use of the equipment.   Pulmonary Rehab from 09/27/2016 in [facility] Cardiac and Pulmonary Rehab  Date  08/26/16  Educator  AS  Instruction Review Code  2- meets goals/outcomes      Infection Prevention: - Provides verbal and written material to individual with discussion of infection control including proper hand washing and proper equipment cleaning during exercise session.   Pulmonary Rehab from 09/27/2016 in [facility] Cardiac and Pulmonary Rehab  Date  08/26/16  Educator  AS  Instruction Review Code  2- meets goals/outcomes      Falls Prevention: - Provides verbal and written material to individual with discussion of falls prevention and safety.   Pulmonary Rehab from 09/27/2016 in [facility] Cardiac and Pulmonary Rehab  Date  08/20/16  Educator  LB  Instruction  Review Code  2- meets goals/outcomes      Diabetes: - Individual verbal and written instruction to review signs/symptoms of diabetes, desired ranges of glucose level fasting, after meals and with exercise. Advice that pre and post exercise glucose checks will be done for 3 sessions at entry of program.   Chronic Lung Diseases: - Group verbal and written instruction to review new updates, new respiratory medications, new advancements in procedures and treatments. Provide informative websites and "800" numbers of self-education.   Lung Procedures: - Group verbal and written instruction to describe testing methods done to diagnose lung disease. Review the outcome of  test results. Describe the treatment choices: Pulmonary Function Tests, ABGs and oximetry.   Energy Conservation: - Provide group verbal and written instruction for methods to conserve energy, plan and organize activities. Instruct on pacing techniques, use of adaptive equipment and posture/positioning to relieve shortness of breath.   Triggers: - Group verbal and written instruction to review types of environmental controls: home humidity, furnaces, filters, dust mite/pet prevention, HEPA vacuums. To discuss weather changes, air quality and the benefits of nasal washing.   Exacerbations: - Group verbal and written instruction to provide: warning signs, infection symptoms, calling MD promptly, preventive modes, and value of vaccinations. Review: effective airway clearance, coughing and/or vibration techniques. Create an Sports administrator.   Oxygen: - Individual and group verbal and written instruction on oxygen therapy. Includes supplement oxygen, available portable oxygen systems, continuous and intermittent flow rates, oxygen safety, concentrators, and Medicare reimbursement for oxygen.   Pulmonary Rehab from 09/27/2016 in [facility] Cardiac and Pulmonary Rehab  Date  08/20/16  Educator  LB  Instruction Review Code  2- meets goals/outcomes      Respiratory Medications: - Group verbal and written instruction to review medications for lung disease. Drug class, frequency, complications, importance of spacers, rinsing mouth after steroid MDI's, and proper cleaning methods for nebulizers.   AED/CPR: - Group verbal and written instruction with the use of models to demonstrate the basic use of the AED with the basic ABC's of resuscitation.   Breathing Retraining: - Provides individuals verbal and written instruction on purpose, frequency, and proper technique of diaphragmatic breathing and pursed-lipped breathing. Applies individual practice skills.   Pulmonary Rehab from 09/27/2016 in [facility] Cardiac and  Pulmonary Rehab  Date  08/26/16  Educator  AS  Instruction Review Code  2- meets goals/outcomes      Anatomy and Physiology of the Lungs: - Group verbal and written instruction with the use of models to provide basic lung anatomy and physiology related to function, structure and complications of lung disease.   Pulmonary Rehab from 09/27/2016 in [facility] Cardiac and Pulmonary Rehab  Date  09/13/16  Educator  LB  Instruction Review Code  2- meets goals/outcomes      Heart Failure: - Group verbal and written instruction on the basics of heart failure: signs/symptoms, treatments, explanation of ejection fraction, enlarged heart and cardiomyopathy.   Sleep Apnea: - Individual verbal and written instruction to review Obstructive Sleep Apnea. Review of risk factors, methods for diagnosing and types of masks and machines for OSA.   Anxiety: - Provides group, verbal and written instruction on the correlation between heart/lung disease and anxiety, treatment options, and management of anxiety.   Relaxation: - Provides group, verbal and written instruction about the benefits of relaxation for patients with heart/lung disease. Also provides patients with examples of relaxation techniques.   Knowledge Questionnaire Score:     Knowledge Questionnaire Score -  08/20/16 1143      Knowledge Questionnaire Score   Pre Score 9/10       Core Components/Risk Factors/Patient Goals at Admission:     Personal Goals and Risk Factors at Admission - 08/20/16 1146      Core Components/Risk Factors/Patient Goals on Admission    Weight Management Yes   Intervention Weight Management: Develop a combined nutrition and exercise program designed to reach desired caloric intake, while maintaining appropriate intake of nutrient and fiber, sodium and fats, and appropriate energy expenditure required for the weight goal.;Weight Management: Provide education and appropriate resources to help participant work on  and attain dietary goals.   Admit Weight 186 lb (84.4 kg)   Goal Weight: Short Term 180 lb (81.6 kg)   Goal Weight: Long Term 163 lb (73.9 kg)   Expected Outcomes Short Term: Continue to assess and modify interventions until short term weight is achieved;Long Term: Adherence to nutrition and physical activity/exercise program aimed toward attainment of established weight goal;Weight Loss: Understanding of general recommendations for a balanced deficit meal plan, which promotes 1-2 lb weight loss per week and includes a negative energy balance of [phone] kcal/d;Weight Maintenance: Understanding of the daily nutrition guidelines, which includes 25-35% calories from fat, 7% or less cal from saturated fats, less than 241m cholesterol, less than 1.5gm of sodium, & 5 or more servings of fruits and vegetables daily;Understanding recommendations for meals to include 15-35% energy as protein, 25-35% energy from fat, 35-60% energy from carbohydrates, less than 2058mof dietary cholesterol, 20-35 gm of total fiber daily;Understanding of distribution of calorie intake throughout the day with the consumption of 4-5 meals/snacks   Improve shortness of breath with ADL's Yes   Intervention Provide education, individualized exercise plan and daily activity instruction to help decrease symptoms of SOB with activities of daily living.   Expected Outcomes Short Term: Achieves a reduction of symptoms when performing activities of daily living.   Develop more efficient breathing techniques such as purse lipped breathing and diaphragmatic breathing; and practicing self-pacing with activity Yes   Intervention Provide education, demonstration and support about specific breathing techniuqes utilized for more efficient breathing. Include techniques such as pursed lipped breathing, diaphragmatic breathing and self-pacing activity.   Expected Outcomes Short Term: Participant will be able to demonstrate and use breathing techniques  as needed throughout daily activities.   Increase knowledge of respiratory medications and ability to use respiratory devices properly  Yes  6-10 liters/min   Intervention Provide education and demonstration as needed of appropriate use of medications, inhalers, and oxygen therapy.   Expected Outcomes Short Term: Achieves understanding of medications use. Understands that oxygen is a medication prescribed by physician. Demonstrates appropriate use of inhaler and oxygen therapy.   Heart Failure Yes   Intervention Provide a combined exercise and nutrition program that is supplemented with education, support and counseling about heart failure. Directed toward relieving symptoms such as shortness of breath, decreased exercise tolerance, and extremity edema.   Expected Outcomes Improve functional capacity of life;Short term: Attendance in program 2-3 days a week with increased exercise capacity. Reported lower sodium intake. Reported increased fruit and vegetable intake. Reports medication compliance.;Short term: Daily weights obtained and reported for increase. Utilizing diuretic protocols set by physician.;Long term: Adoption of self-care skills and reduction of barriers for early signs and symptoms recognition and intervention leading to self-care maintenance.      Core Components/Risk Factors/Patient Goals Review:      Goals and Risk Factor Review  Lea Name 08/26/16 1128 09/17/16 0702           Core Components/Risk Factors/Patient Goals Review   Personal Goals Review Develop more efficient breathing techniques such as purse lipped breathing and diaphragmatic breathing and practicing self-pacing with activity. Weight Management/Obesity;Improve shortness of breath with ADL's;Increase knowledge of respiratory medications and ability to use respiratory devices properly.;Develop more efficient breathing techniques such as purse lipped breathing and diaphragmatic breathing and practicing self-pacing  with activity.      Review Pursed lip breathing was discussed and demonstrated with the patient. The patient showed understanding of how and when to use this breathing technique to help with shortness of breath and oxygen saturations.  Ms Steinberger is making progress with her exercise goals. She self-adjusts her oxygen from 6-8l/m on the T4 anad XR, and then increases her oxygen to 10l/m with the treadmill.  Her weight management is through the transplant dietitian at [facility]. Fortunately her latest echo showed improvement to her EF - from 35% to 40-45%; the transplant goal is at least 50%. Ms Karpel has good PLB technique and uses pacing as needed. She attends regularly and enjoys the education.      Expected Outcomes Ms. Voisin will use pursed lip breathing during exercise and ADL to help control SOB and maintain acceptable oxygen saturations.  Continue improving her exercise progression and continue her cardiac medicine for improved EF.         Core Components/Risk Factors/Patient Goals at Discharge (Final Review):      Goals and Risk Factor Review - 09/17/16 0702      Core Components/Risk Factors/Patient Goals Review   Personal Goals Review Weight Management/Obesity;Improve shortness of breath with ADL's;Increase knowledge of respiratory medications and ability to use respiratory devices properly.;Develop more efficient breathing techniques such as purse lipped breathing and diaphragmatic breathing and practicing self-pacing with activity.   Review Ms Chopra is making progress with her exercise goals. She self-adjusts her oxygen from 6-8l/m on the T4 anad XR, and then increases her oxygen to 10l/m with the treadmill.  Her weight management is through the transplant dietitian at [facility]. Fortunately her latest echo showed improvement to her EF - from 35% to 40-45%; the transplant goal is at least 50%. Ms Brinkman has good PLB technique and uses pacing as needed. She attends regularly and enjoys the education.   Expected  Outcomes Continue improving her exercise progression and continue her cardiac medicine for improved EF.      ITP Comments:     ITP Comments    Row Name 09/04/16 1528           ITP Comments Ms Welcome called out Monday and Wednesday from [place]. She has a cold, but plans to come back Friday          Comments: 30 Day Note Review

## 2016-09-30 NOTE — Progress Notes (Signed)
Daily Session Note  Patient Details  Name: Shelby Graham MRN: [id number] Date of Birth: Dec 11, 1965 Referring Provider:     Pulmonary Rehab from 08/20/2016 in [facility] Cardiac and Pulmonary Rehab  Referring Provider  Farley Ly      Encounter Date: 09/30/2016  Check In:     Session Check In - 09/30/16 1208      Check-In   Location [facility]-Cardiac & Pulmonary Rehab   Staff Present Earlean Shawl, BS, ACSM CEP, Exercise Physiologist;Laureen Owens Shark, BS, RRT, Respiratory Dareen Piano, BA, ACSM CEP, Exercise Physiologist   Supervising physician immediately available to respond to emergencies [facility] immediately available ER MD   Physician(s) Corky Downs and Jimmye Norman   Medication changes reported     No   Fall or balance concerns reported    No   Warm-up and Cool-down Performed as group-led instruction   Resistance Training Performed Yes   VAD Patient? No     Pain Assessment   Currently in Pain? No/denies   Multiple Pain Sites No         History  Smoking Status  . Never Smoker  Smokeless Tobacco  . Never Used    Goals Met:  Proper associated with RPD/PD & O2 Sat Independence with exercise equipment Exercise tolerated well Strength training completed today  Goals Unmet:  Not Applicable  Comments: Pt able to follow exercise prescription today without complaint.  Will continue to monitor for progression.    Dr. Emily Filbert is Medical Director for [place] and [facility].

## 2016-10-02 DIAGNOSIS — J849 Interstitial pulmonary disease, unspecified: Secondary | ICD-10-CM | POA: Diagnosis not present

## 2016-10-02 DIAGNOSIS — J8410 Pulmonary fibrosis, unspecified: Secondary | ICD-10-CM

## 2016-10-02 NOTE — Progress Notes (Signed)
Daily Session Note  Patient Details  Name: Shelby Graham MRN: [id number] Date of Birth: 06-Dec-1965 Referring Provider:     Pulmonary Rehab from 08/20/2016 in [facility] Cardiac and Pulmonary Rehab  Referring Provider  Farley Ly      Encounter Date: 10/02/2016  Check In:     Session Check In - 10/02/16 1334      Check-In   Location [facility]-Cardiac & Pulmonary Rehab   Staff Present Carson Myrtle, BS, RRT, Respiratory Lennie Hummer, MA, ACSM RCEP, Exercise Physiologist;Jerricka Carvey Oletta Darter, BA, ACSM CEP, Exercise Physiologist   Supervising physician immediately available to respond to emergencies [facility] immediately available ER MD   Physician(s) Jimmye Norman and Darl Householder   Medication changes reported     No   Fall or balance concerns reported    No   Warm-up and Cool-down Performed as group-led instruction   Resistance Training Performed Yes   VAD Patient? No     Pain Assessment   Currently in Pain? No/denies           Exercise Prescription Changes - 10/02/16 1300      Response to Exercise   Blood Pressure (Admit) 110/66   Blood Pressure (Exercise) 146/70   Blood Pressure (Exit) 132/70   Heart Rate (Admit) 64 bpm   Heart Rate (Exercise) 80 bpm   Heart Rate (Exit) 49 bpm   Oxygen Saturation (Admit) 91 %   Oxygen Saturation (Exercise) 83 %   Oxygen Saturation (Exit) 100 %   Rating of Perceived Exertion (Exercise) 13   Perceived Dyspnea (Exercise) 3   Duration Progress to 45 minutes of aerobic exercise without signs/symptoms of physical distress   Intensity THRR unchanged     Progression   Progression Continue to progress workloads to maintain intensity without signs/symptoms of physical distress.     Resistance Training   Training Prescription Yes   Weight 3   Reps 10-15     Oxygen   Oxygen Continuous   Liters 6-15  mask     Treadmill   MPH 1.2   Grade 0  02 sat dropped   Minutes 15   METs 1.92     NuStep   Level 2   SPM 65   Minutes 15   METs 2.3       History  Smoking Status  . Never Smoker  Smokeless Tobacco  . Never Used    Goals Met: Independence with equipment, strength training completed   Goals Unmet:  Not Applicable  Comments: Staff will continue to monitor [name] for proper O2 during exercise.   Dr. Emily Filbert is Medical Director for [place] and [facility].

## 2016-10-04 ENCOUNTER — Telehealth: Payer: Self-pay

## 2016-10-09 ENCOUNTER — Telehealth: Payer: Self-pay | Admitting: Respiratory Therapy

## 2016-10-09 ENCOUNTER — Encounter: Payer: Self-pay | Admitting: Respiratory Therapy

## 2016-10-09 DIAGNOSIS — J8410 Pulmonary fibrosis, unspecified: Secondary | ICD-10-CM

## 2016-10-09 NOTE — Telephone Encounter (Signed)
Called Shelby Graham to check on her. She has had low O2Sat with exercise. During an appointment with Dr Dudley Major, Shelby Kyllonen was cleared to wear a nonrebreather mask with exercise or use 10-15 l/m [place]. Dr Dudley Major is checking with her cardiologist about her medication Toprol which could be contributing to her shortness of breath.

## 2016-10-14 ENCOUNTER — Encounter: Payer: 59 | Admitting: *Deleted

## 2016-10-14 DIAGNOSIS — J8410 Pulmonary fibrosis, unspecified: Secondary | ICD-10-CM

## 2016-10-14 DIAGNOSIS — J849 Interstitial pulmonary disease, unspecified: Secondary | ICD-10-CM | POA: Diagnosis not present

## 2016-10-14 NOTE — Progress Notes (Signed)
Daily Session Note  Patient Details  Name: Shelby Graham MRN: [id number] Date of Birth: 1966/04/17 Referring Provider:     Pulmonary Rehab from 08/20/2016 in [facility] Cardiac and Pulmonary Rehab  Referring Provider  Farley Ly      Encounter Date: 10/14/2016  Check In:     Session Check In - 10/14/16 1015      Check-In   Location [facility]-Cardiac & Pulmonary Rehab   Staff Present Carson Myrtle, BS, RRT, Respiratory Therapist;Jebadiah Imperato Amedeo Plenty, BS, ACSM CEP, Exercise Physiologist;Amanda Oletta Darter, BA, ACSM CEP, Exercise Physiologist   Supervising physician immediately available to respond to emergencies [facility] immediately available ER MD   Physician(s) Mariea Clonts and Alfred Levins   Medication changes reported     No   Fall or balance concerns reported    No   Warm-up and Cool-down Performed as group-led instruction   Resistance Training Performed Yes   VAD Patient? No     Pain Assessment   Currently in Pain? No/denies   Multiple Pain Sites No         History  Smoking Status  . Never Smoker  Smokeless Tobacco  . Never Used    Goals Met:  Proper associated with RPD/PD & O2 Sat Independence with exercise equipment Exercise tolerated well Strength training completed today  Goals Unmet:  Not Applicable  Comments: Pt able to follow exercise prescription today without complaint.  Will continue to monitor for progression.    Dr. Emily Filbert is Medical Director for [place] and [facility].

## 2016-10-22 ENCOUNTER — Encounter: Payer: Self-pay | Admitting: Respiratory Therapy

## 2016-10-22 NOTE — Progress Notes (Signed)
Pulmonary Individual Treatment Plan  Patient Details  Name: Shelby Graham MRN: [id number] Date of Birth: [birth year] Referring Provider:     Pulmonary Rehab from 08/20/2016 in [facility] Cardiac and Pulmonary Rehab  Referring Provider  Farley Ly      Initial Encounter Date:    Pulmonary Rehab from 08/20/2016 in [facility] Cardiac and Pulmonary Rehab  Date  08/20/16  Referring Provider  Farley Ly      Visit Diagnosis: No diagnosis found.  Patient's Home Medications on Admission:  Current Outpatient Prescriptions:    amoxicillin-clavulanate (AUGMENTIN) 875-125 MG tablet, Take 1 tablet by mouth 2 (two) times daily., Disp: 20 tablet, Rfl: 0   azithromycin (ZITHROMAX) 250 MG tablet, Take 1 tablet (250 mg total) by mouth daily., Disp: 30 tablet, Rfl: 0   esomeprazole (NEXIUM) 40 MG capsule, , Disp: , Rfl:    levothyroxine (SYNTHROID, LEVOTHROID) 50 MCG tablet, TAKE 1 TABLET (50 MCG TOTAL) BY MOUTH DAILY BEFORE BREAKFAST., Disp: 30 tablet, Rfl: 5   lisinopril (PRINIVIL,ZESTRIL) 5 MG tablet, Take 5 mg by mouth., Disp: , Rfl:    metoprolol succinate (TOPROL-XL) 25 MG 24 hr tablet, Take 75 mg by mouth., Disp: , Rfl:    mupirocin ointment (BACTROBAN) 2 %, Place 1 application into the nose 2 (two) times daily., Disp: 22 g, Rfl: 0   mycophenolate (CELLCEPT) 500 MG tablet, Take by mouth. , Disp: , Rfl:    ondansetron (ZOFRAN) 4 MG tablet, Take 1 tablet (4 mg total) by mouth every 8 (eight) hours as needed for nausea or vomiting., Disp: 20 tablet, Rfl: 0   ranitidine (ZANTAC) 150 MG capsule, Take by mouth., Disp: , Rfl:    sertraline (ZOLOFT) 50 MG tablet, Take 50 mg by mouth., Disp: , Rfl:    Vitamin D, Ergocalciferol, (DRISDOL) 50000 units CAPS capsule, TAKE 1 CAPSULE (50,000 UNITS TOTAL) BY MOUTH EVERY 7 (SEVEN) DAYS., Disp: 4 capsule, Rfl: 6   zolpidem (AMBIEN) 5 MG tablet, Take 5 mg by mouth., Disp: , Rfl:   Past Medical History: Past Medical History:  Diagnosis Date   Allergic rhinitis     Anemia    Depression    History of torn meniscus of left knee 2005   surgical repair of meniscus   Hyperlipidemia    Lung disease    NSIP (nonspecific interstitial pneumonia) (HCC)    Postinflammatory pulmonary fibrosis (HCC)    Shortness of breath dyspnea    Vitamin D deficiency     Tobacco Use: History  Smoking Status   Never Smoker  Smokeless Tobacco   Never Used    Labs: Recent Review Flowsheet Data    Labs for ITP Cardiac and Pulmonary Rehab Latest Ref Rng & Units 12/05/2015   Cholestrol 100 - 199 mg/dL 206(H)   LDLCALC 0 - 99 mg/dL 133(H)   HDL >39 mg/dL 47   Trlycerides 0 - 149 mg/dL 128       ADL UCSD:     Pulmonary Assessment Scores    Row Name 08/20/16 1143         ADL UCSD   ADL Phase Entry     SOB Score total 95     Rest 1     Walk 3     Stairs 5     Bath 5     Dress 4     Shop 4        Pulmonary Function Assessment:     Pulmonary Function Assessment - 08/26/16 0617  Pulmonary Function Tests   FVC% 41.3 %   FEV1% 50.9 %   FEV1/FVC Ratio 98.14   DLCO% 23.1 %     Initial Spirometry Results   Comments Test date      Exercise Target Goals:    Exercise Program Goal: Individual exercise prescription set with THRR, safety & activity barriers. Participant demonstrates ability to understand and report RPE using BORG scale, to self-measure pulse accurately, and to acknowledge the importance of the exercise prescription.  Exercise Prescription Goal: Starting with aerobic activity 30 plus minutes a day, 3 days per week for initial exercise prescription. Provide home exercise prescription and guidelines that participant acknowledges understanding prior to discharge.  Activity Barriers & Risk Stratification:   6 Minute Walk:     6 Minute Walk    Row Name 08/20/16 1211         6 Minute Walk   Distance 825 feet     Walk Time 4.75 minutes     # of Rest Breaks 1     MPH 1.97     METS 2.85     RPE 13     Perceived  Dyspnea  3     VO2 Peak 9.99     Symptoms Yes (comment)     Comments O2 sats dropped to 84 at 2:29 - she stopped until it reached 88 and then continued until 6 min was achieved.     Resting HR 56 bpm     Resting BP 104/60     Max Ex. HR 102 bpm     Max Ex. BP 134/74       Interval HR   Baseline HR 56     2 Minute HR 102     5 Minute HR 91     6 Minute HR 98     2 Minute Post HR 65     Interval Heart Rate? Yes       Interval Oxygen   Interval Oxygen? Yes     Baseline Oxygen Saturation % 99 %     Baseline Liters of Oxygen 10 L     1 Minute Oxygen Saturation % 90 %     1 Minute Liters of Oxygen 10 L     2 Minute Oxygen Saturation % 87 %     2 Minute Liters of Oxygen 10 L     3 Minute Liters of Oxygen 10 L     4 Minute Oxygen Saturation % 88 %     4 Minute Liters of Oxygen 10 L     5 Minute Oxygen Saturation % 91 %     5 Minute Liters of Oxygen 10 L     6 Minute Oxygen Saturation % 84 %     6 Minute Liters of Oxygen 10 L     2 Minute Post Oxygen Saturation % 88 %     2 Minute Post Liters of Oxygen 10 L       Oxygen Initial Assessment:     Oxygen Initial Assessment - 08/28/16 1502      Home Oxygen   Home Oxygen Device E-Tanks   Sleep Oxygen Prescription Continuous   Liters per minute 6   Home Exercise Oxygen Prescription Continuous   Liters per minute --  6-10l/m   Home at Rest Exercise Oxygen Prescription Continuous  6-10l/m; uses simple mask with some activity   Compliance with Home Oxygen Use Yes     Initial 6 min Walk  Oxygen Used Continuous   Liters per minute 10   Resting Oxygen Saturation  during 6 min walk 99 %   Exercise Oxygen Saturation  during 6 min walk 84 %     Program Oxygen Prescription   Program Oxygen Prescription Continuous;E-Tanks   Liters per minute 8   Comments On treadmill, VentiMask on 55% at 15l/m     Intervention   Short Term Goals To learn and exhibit compliance with exercise, home and travel O2 prescription;To learn and  understand importance of monitoring SPO2 with pulse oximeter and demonstrate accurate use of the pulse oximeter.;To Learn and understand importance of maintaining oxygen saturations>88%;To learn and demonstrate proper purse lipped breathing techniques or other breathing techniques.   Long  Term Goals Exhibits compliance with exercise, home and travel O2 prescription;Verbalizes importance of monitoring SPO2 with pulse oximeter and return demonstration;Maintenance of O2 saturations>88%;Exhibits proper breathing techniques, such as purse lipped breathing or other method taught during program session      Oxygen Re-Evaluation:     Oxygen Re-Evaluation    Row Name 10/02/16 1444 10/15/16 0627           Program Oxygen Prescription   Program Oxygen Prescription Continuous;E-Tanks Continuous;E-Tanks      Liters per minute  -- --  NonRebreather Mask 100%      Comments Using 60% Simple Mask Contacted Dr Farley Ly and he signed  oxygen order change.        Home Oxygen   Home Oxygen Device Portable Concentrator;E-Tanks Portable Concentrator;E-Tanks      Sleep Oxygen Prescription Continuous Continuous      Liters per minute 6 6      Home Exercise Oxygen Prescription Continuous Continuous      Liters per minute --  60% Simple Mask 15      Home at Rest Exercise Oxygen Prescription Continuous Continuous      Liters per minute --  6-10l/m or 60% Simple mask 6      Compliance with Home Oxygen Use Yes Yes        Goals/Expected Outcomes   Comments Ms Lefebre's oxygen company switched her mask to a nonrebreather mask. On the treadmill, she is dropping her O2Sat's to the low 80's with the mask, so it will be helpful for Ms Streck to use a nonrebreather mask. I also recommended that she has the volunteers bring her down to [place]. It is too far a distance,  and she arrives with low O2Sat's and is exhausted. At a recent appointment with Dr Farley Ly, he is concerned that her shortness of breath, low O2Sat's and  tiredness could be due to the new cardiac drugs she was recently started on.  Dr Farley Ly will contact her cardiologist about this matter.         Oxygen Discharge (Final Oxygen Re-Evaluation):     Oxygen Re-Evaluation - 10/15/16 0627      Program Oxygen Prescription   Program Oxygen Prescription Continuous;E-Tanks   Liters per minute --  NonRebreather Mask 100%   Comments Contacted Dr Farley Ly and he signed  oxygen order change.     Home Oxygen   Home Oxygen Device Portable Concentrator;E-Tanks   Sleep Oxygen Prescription Continuous   Liters per minute 6   Home Exercise Oxygen Prescription Continuous   Liters per minute 15   Home at Rest Exercise Oxygen Prescription Continuous   Liters per minute 6   Compliance with Home Oxygen Use Yes     Goals/Expected Outcomes   Comments At  a recent appointment with Dr Farley Ly, he is concerned that her shortness of breath, low O2Sat's and tiredness could be due to the new cardiac drugs she was recently started on.  Dr Farley Ly will contact her cardiologist about this matter.      Initial Exercise Prescription:     Initial Exercise Prescription - 08/20/16 1200      Date of Initial Exercise RX and Referring Provider   Date 08/20/16   Referring Provider Farley Ly     Oxygen   Oxygen Continuous   Liters 6-10     Treadmill   MPH 1.8   Grade 0   Minutes 15   METs 2.38     NuStep   Level 2   SPM 80   Minutes 15   METs 2.4     REL-XR   Level 2   Speed 60   Minutes 15   METs 2.4     Prescription Details   Frequency (times per week) 3   Duration Progress to 45 minutes of aerobic exercise without signs/symptoms of physical distress     Intensity   THRR 40-80% of Max Heartrate 102-148   Ratings of Perceived Exertion 11-15   Perceived Dyspnea 0-4     Resistance Training   Training Prescription Yes   Weight 3   Reps 10-15      Perform Capillary Blood Glucose checks as needed.  Exercise Prescription Changes:     Exercise  Prescription Changes    Row Name 08/26/16 1100 09/06/16 1100 09/18/16 1200 10/02/16 1300 10/16/16 1400     Response to Exercise   Blood Pressure (Admit) 104/60 102/56 98/54 110/66 122/70   Blood Pressure (Exercise) 118/66 98/50  -- 146/70 102/79   Blood Pressure (Exit) 107/64 94/52 108/60 132/70 90/52   Heart Rate (Admit) 60 bpm 45 bpm 59 bpm 64 bpm 89 bpm   Heart Rate (Exercise) 87 bpm 69 bpm 77 bpm 80 bpm 91 bpm   Heart Rate (Exit) 50 bpm 43 bpm 59 bpm 49 bpm 74 bpm   Oxygen Saturation (Admit) 93 % 94 % 98 % 91 % 90 %   Oxygen Saturation (Exercise) 90 % 93 % 88 % 83 % 91 %   Oxygen Saturation (Exit) 99 % 92 % 92 % 100 % 98 %   Rating of Perceived Exertion (Exercise) '13 13 12 13 12   ' Perceived Dyspnea (Exercise) '3 2 2 3 2   ' Duration  -- Progress to 45 minutes of aerobic exercise without signs/symptoms of physical distress Continue with 45 min of aerobic exercise without signs/symptoms of physical distress. Progress to 45 minutes of aerobic exercise without signs/symptoms of physical distress Progress to 45 minutes of aerobic exercise without signs/symptoms of physical distress   Intensity  -- THRR unchanged THRR unchanged THRR unchanged THRR unchanged     Progression   Progression  -- Continue to progress workloads to maintain intensity without signs/symptoms of physical distress. Continue to progress workloads to maintain intensity without signs/symptoms of physical distress. Continue to progress workloads to maintain intensity without signs/symptoms of physical distress. Continue to progress workloads to maintain intensity without signs/symptoms of physical distress.     Resistance Training   Training Prescription Yes Yes Yes Yes Yes   Weight '3 3 3 3 3   ' Reps 10-15 10-15 10-15 10-15 10-15     Oxygen   Oxygen Continuous Continuous Continuous Continuous Continuous   Liters 6-10 6-10 6-10 6-15  mask 6-15  mask  Treadmill   MPH 1.8  -- 1.8 1.2  --   Grade 0  -- 0 0  02 sat  dropped  --   Minutes 15  -- 15 15  --   METs 2.38  -- 2.38 1.92  --     NuStep   Level '2 2 2 2 2   ' SPM 80 84 61 65 70   Minutes '15 15 15 15 30   ' METs 2.4 2.2 1.5 2.3 1.8     REL-XR   Level '2 2 2  ' --  --   Speed 60  --  --  --  --   Minutes '15 15 15  ' --  --   METs 2.4  -- 2.1  --  --     Home Exercise Plan   Plans to continue exercise at  --  -- Home (comment)  possibly walk at the mall also  --  --   Frequency  --  -- Add 1 additional day to program exercise sessions.  --  --   Initial Home Exercises Provided  --  -- 09/18/16  --  --      Exercise Comments:     Exercise Comments    Row Name 08/26/16 1134 08/30/16 1131 09/09/16 1120 09/18/16 1239 09/27/16 1132   Exercise Comments First full day of exercise!  Patient was oriented to gym and equipment including functions, settings, policies, and procedures.  Patient's individual exercise prescription and treatment plan were reviewed.  All starting workloads were established based on the results of the 6 minute walk test done at initial orientation visit.  The plan for exercise progression was also introduced and progression will be customized based on patient's performance and goals Lelon Frohlich came in with lower than normal BP 102/58 while seated and stated she felt slightly dizzy when standing up. Her metoprolol was increased recently by her cardiologist and has been advised to monitor BP at home and call her cardiologist if continues to be low and symptomatic.  Stated she was more winded than normal but O2 sats were stable at 93% on 6L at rest. She was instructed no to get on the treadmill today and she only did seated exercise and was given water to drink. Her Oxygen demand during exercise increased to 8L and then 10L to maintain O2sat> or = 90%. Her blood pressure dropped slightly to 98/56 but remained stable for the duration of her exercise. Final BP after exercise was 94/52 and O2 sats maintained >90%.  Ms. Italiano switched from the Bio-Step  to the Nu-Step today due to equipment avalability today. She tolerated the new equipment well.   Reviewed home exercise with pt today.  Pt plans to walk and stretch for exercise.  Reviewed THR, pulse, RPE, sign and symptoms, NTG use, and when to call 911 or MD.  Also discussed weather considerations and indoor options.  Pt voiced understanding.    I had her not do the Treadmill today since she felt so short of breath due to the high pollen count.    [place] Name 10/16/16 1418           Exercise Comments Lelon Frohlich has had a hard time with exercise. She is discussing with her Dr to see if it is heart related, or related to the medications she is on.          Exercise Goals and Review:   Exercise Goals Re-Evaluation :   Discharge Exercise Prescription (Final Exercise Prescription Changes):  Exercise Prescription Changes - 10/16/16 1400      Response to Exercise   Blood Pressure (Admit) 122/70   Blood Pressure (Exercise) 102/79   Blood Pressure (Exit) 90/52   Heart Rate (Admit) 89 bpm   Heart Rate (Exercise) 91 bpm   Heart Rate (Exit) 74 bpm   Oxygen Saturation (Admit) 90 %   Oxygen Saturation (Exercise) 91 %   Oxygen Saturation (Exit) 98 %   Rating of Perceived Exertion (Exercise) 12   Perceived Dyspnea (Exercise) 2   Duration Progress to 45 minutes of aerobic exercise without signs/symptoms of physical distress   Intensity THRR unchanged     Progression   Progression Continue to progress workloads to maintain intensity without signs/symptoms of physical distress.     Resistance Training   Training Prescription Yes   Weight 3   Reps 10-15     Oxygen   Oxygen Continuous   Liters 6-15  mask      NuStep   Level 2   SPM 70   Minutes 30   METs 1.8      Nutrition:  Target Goals: Understanding of nutrition guidelines, daily intake of sodium <1541m, cholesterol <2066m calories 30% from fat and 7% or less from saturated fats, daily to have 5 or more servings of fruits and  vegetables.  Biometrics:     Pre Biometrics - 08/20/16 1211      Pre Biometrics   Height 5' 2.5" (1.588 m)   Weight 187 lb 8 oz (85 kg)   Waist Circumference 34.5 inches   Hip Circumference 49 inches   Waist to Hip Ratio 0.7 %   BMI (Calculated) 33.8       Nutrition Therapy Plan and Nutrition Goals:   Nutrition Discharge: Rate Your Plate Scores:   Nutrition Goals Re-Evaluation:   Nutrition Goals Discharge (Final Nutrition Goals Re-Evaluation):   Psychosocial: Target Goals: Acknowledge presence or absence of significant depression and/or stress, maximize coping skills, provide positive support system. Participant is able to verbalize types and ability to use techniques and skills needed for reducing stress and depression.   Initial Review & Psychosocial Screening:     Initial Psych Review & Screening - 08/20/16 11[place] KeyYes   Comments Ms MaFurbers preparing for a Lung Transplant. [facility]. She is looking forward to [facility] - having supervised exercise.     Screening Interventions   Interventions Encouraged to exercise      Quality of Life Scores:     Quality of Life - 08/20/16 1152      Quality of Life Scores   Health/Function Pre 20.81 %   Socioeconomic Pre 20.38 %   Psych/Spiritual Pre 20.43 %   Family Pre 21 %   GLOBAL Pre 20.67 %      PHQ-9: Recent Review Flowsheet Data    [facility]   Decreased Interest 1   Down, Depressed, Hopeless 1   PHQ - 2 Score 2   Altered sleeping 3   Tired, decreased energy 1   Change in appetite 0   Feeling bad or failure about yourself  1   Trouble concentrating 0   Moving slowly or fidgety/restless 0   Suicidal thoughts 0   PHQ-9 Score 7   Difficult doing work/chores Very difficult  Interpretation of Total Score   Total Score Depression Severity:  1-4 = Minimal depression, 5-9 = Mild depression, 10-14 = Moderate depression, 15-19 = Moderately severe depression, 20-27 = Severe depression   Psychosocial Evaluation and Intervention:     Psychosocial Evaluation - 08/28/16 1108      Psychosocial Evaluation & Interventions   Interventions Encouraged to exercise with the program and follow exercise prescription;Stress management education;Relaxation education   Comments Counselor met today with Ms. Marva Panda") for initial psychosocial evaluation.  She is a 51 year old who has NSIP (Non-specific institutial pneumonia) and is on the transplant list for a double lung transplant.  She has a strong support system living with her daugher and her son, sisters and mother are close by.  Lelon Frohlich is also actively involved in her local church.  She reports not sleeping well (3-4 hours typically per night) and has Ambien PRN to help with this; which is successful occasionally.  Lelon Frohlich has a history of depression and anxiety and is seeing a Transport planner and a psychiatrist to manage this.  She takes medication for her anxiety primarily and states that it helps.  She is out on disability and feels isolated at times with carrying an oxygen tank around with her everywhere.  She states she is typically in a good mood, but does feel more irritable lately.  Lelon Frohlich has goals to increase her stamina and strength; to eat better; and to improve her mood and feelings of isolation by the socialization aspects of this program.  She will benefit from the psychoeducational components of this program as well.     Expected Outcomes Lelon Frohlich will consistently exercise; she will be educated on healthier eating and hopefully lose weight as a result; [name] hopes to improve her sleep and mood with sleep as well.  her ultimate goal is to be strong enough to get the double lung transplants.    Continue Psychosocial Services  Follow up required by staff      Psychosocial  Re-Evaluation:     Psychosocial Re-Evaluation    [place] Name 09/16/16 1132 09/18/16 1029 09/27/16 1129 09/30/16 1108       Psychosocial Re-Evaluation   Current issues with Current Anxiety/Panic;Current Stress Concerns;Current Sleep Concerns  --  --  --    Comments Counselor follow up with Lelon Frohlich today who came in late and tearful.  Counselor provided supportive services and assessed her current mental health symptoms.   Lelon Frohlich is reporting increased anxiety; irritability; lack of motivation; more isolation; ongoing sleep problems and use of food to treat these symptoms.   Counselor provided CBT and use of thought stopping and thought restructuring with Lelon Frohlich to help her with these thoughts and feelings.  Counselor also encouraged her to contact her psychiatrist to consider increasing her current medications to help during this period of increased anxiety and depressive symptoms.  Counselor encouraged her to make a list of positive things to try to combat the negative thoughts and work to "choose" to focus on the positive as much as possible currently.  Counselor will monitor these symptoms in [place] over the next few weeks.  Counselor also encouraged her to speak with her family honestly about these challenges and how to help her during this time.   Brief follow up with Lelon Frohlich today stating she contacted her psychiatrist who increased her medications to 75 mg vs. 50 of Zoloft.  She showed counselor a list of positive things she is working on to change her focus to the  positive vs. negative in her life currently.  Lelon Frohlich was doing a little better today and should pick the medication up after class and begin tonight.  Counselor provided her with a therapist contact information to begin working with if she chooses to change from her current provider.  Counselor will follow with Lelon Frohlich throughout the course of this program.   Lelon Frohlich said it was a tough breathing day due to her pollen. Upon arrival walking down to [facility] even on 6  liters of oxgyen her pulse ox was 76% with rest came up to 91% and we turned her supplemental oxgyen to 8 liters. I had her not do the treadmill today and discused volunteers can bring her down in a wheelchair. "Lelon Frohlich" admits that she starts to panic when she feels she can not catch her breath. I encouraged her to call her [place] and  Counselor follow up today with Lelon Frohlich reporting she has begun the increased dose of SSRI to help with her anxiety and manage her stress better.  She reports (2) panic attacks this past Friday that were stress related and she called her daughter who helped her relax and calm down.  Lelon Frohlich reports she is doing better otherwise in every aspect and enjoys this program.  Counselor discussed use of CBT for thought stopping and replacing negative anxiety-provoking thoughts with positive messages.  Lelon Frohlich states she has not called the alternate therapist yet and appreciated counselor reminding her since it has been over a month since she saw her therapist.  Counselor commended Lelon Frohlich on the progress made so far in this class.  She reports the supportive services by the staff here are helpful in accomplishing her goals and handling her stress better.  Staff will continue to follow.      Expected Outcomes Lelon Frohlich will contact her Psychiatrist to consider increasing her medications at this time to help with her anxious thoughts and feelings.  Lelon Frohlich will make a list of positive things to counter the negative thoughts that she is experiencing.  Lelon Frohlich will attend the stress management; anxiety and relaxation educational components of this program to be educated on other techniqes to use currently.  Counselor will provide a list of alternate therapists for Lelon Frohlich if things do not improve in the near future.   Lelon Frohlich will begin the increased dose of medication this evening and will follow with her psychiatrist in a month as scheduled.  Lelon Frohlich will continue to choose to focus on positives vs negatives in her life and  will contact an alternate therapist if needed to help manage her mood.    --  --    Interventions Stress management education;Encouraged to attend Pulmonary Rehabilitation for the exercise;Physician referral Therapist referral  -- Stress management education;Relaxation education    Continue Psychosocial Services  Follow up required by counselor  --  --  --       Psychosocial Discharge (Final Psychosocial Re-Evaluation):     Psychosocial Re-Evaluation - 09/30/16 1108      Psychosocial Re-Evaluation   Comments Counselor follow up today with Lelon Frohlich reporting she has begun the increased dose of SSRI to help with her anxiety and manage her stress better.  She reports (2) panic attacks this past Friday that were stress related and she called her daughter who helped her relax and calm down.  Lelon Frohlich reports she is doing better otherwise in every aspect and enjoys this program.  Counselor discussed use of CBT for thought stopping and replacing negative anxiety-provoking  thoughts with positive messages.  Lelon Frohlich states she has not called the alternate therapist yet and appreciated counselor reminding her since it has been over a month since she saw her therapist.  Counselor commended Lelon Frohlich on the progress made so far in this class.  She reports the supportive services by the staff here are helpful in accomplishing her goals and handling her stress better.  Staff will continue to follow.     Interventions Stress management education;Relaxation education      Education: Education Goals: Education classes will be provided on a weekly basis, covering required topics. Participant will state understanding/return demonstration of topics presented.  Learning Barriers/Preferences:     Learning Barriers/Preferences - 08/20/16 1143      Learning Barriers/Preferences   Learning Barriers None   Learning Preferences None      Education Topics: Initial Evaluation Education: - Verbal, written and demonstration of  respiratory meds, RPE/PD scales, oximetry and breathing techniques. Instruction on use of nebulizers and MDIs: cleaning and proper use, rinsing mouth with steroid doses and importance of monitoring MDI activations.   Pulmonary Rehab from 10/02/2016 in [facility] Cardiac and Pulmonary Rehab  Date  08/20/16  Educator  LB  Instruction Review Code  2- meets goals/outcomes      General Nutrition Guidelines/Fats and Fiber: -Group instruction provided by verbal, written material, models and posters to present the general guidelines for heart healthy nutrition. Gives an explanation and review of dietary fats and fiber.   Pulmonary Rehab from 10/02/2016 in [facility] Cardiac and Pulmonary Rehab  Date  08/26/16  Educator  CR  Instruction Review Code  2- meets goals/outcomes      Controlling Sodium/Reading Food Labels: -Group verbal and written material supporting the discussion of sodium use in heart healthy nutrition. Review and explanation with models, verbal and written materials for utilization of the food label.   Pulmonary Rehab from 10/02/2016 in [facility] Cardiac and Pulmonary Rehab  Date  09/09/16  Educator  CR  Instruction Review Code  2- meets goals/outcomes      Exercise Physiology & Risk Factors: - Group verbal and written instruction with models to review the exercise physiology of the cardiovascular system and associated critical values. Details cardiovascular disease risk factors and the goals associated with each risk factor.   Pulmonary Rehab from 10/02/2016 in [facility] Cardiac and Pulmonary Rehab  Date  09/27/16  Educator  Nada Maclachlan, EP   Instruction Review Code  2- meets goals/outcomes      Aerobic Exercise & Resistance Training: - Gives group verbal and written discussion on the health impact of inactivity. On the components of aerobic and resistive training programs and the benefits of this training and how to safely progress through these programs.   Flexibility, Balance, General  Exercise Guidelines: - Provides group verbal and written instruction on the benefits of flexibility and balance training programs. Provides general exercise guidelines with specific guidelines to those with heart or lung disease. Demonstration and skill practice provided.   Stress Management: - Provides group verbal and written instruction about the health risks of elevated stress, cause of high stress, and healthy ways to reduce stress.   Depression: - Provides group verbal and written instruction on the correlation between heart/lung disease and depressed mood, treatment options, and the stigmas associated with seeking treatment.   Pulmonary Rehab from 10/02/2016 in [facility] Cardiac and Pulmonary Rehab  Date  08/28/16  Educator  [facility]  Instruction Review Code  2- meets goals/outcomes  Exercise & Equipment Safety: - Individual verbal instruction and demonstration of equipment use and safety with use of the equipment.   Pulmonary Rehab from 10/02/2016 in [facility] Cardiac and Pulmonary Rehab  Date  08/26/16  Educator  AS  Instruction Review Code  2- meets goals/outcomes      Infection Prevention: - Provides verbal and written material to individual with discussion of infection control including proper hand washing and proper equipment cleaning during exercise session.   Pulmonary Rehab from 10/02/2016 in [facility] Cardiac and Pulmonary Rehab  Date  08/26/16  Educator  AS  Instruction Review Code  2- meets goals/outcomes      Falls Prevention: - Provides verbal and written material to individual with discussion of falls prevention and safety.   Pulmonary Rehab from 10/02/2016 in [facility] Cardiac and Pulmonary Rehab  Date  08/20/16  Educator  LB  Instruction Review Code  2- meets goals/outcomes      Diabetes: - Individual verbal and written instruction to review signs/symptoms of diabetes, desired ranges of glucose level fasting, after meals and with exercise. Advice that pre and post exercise  glucose checks will be done for 3 sessions at entry of program.   Chronic Lung Diseases: - Group verbal and written instruction to review new updates, new respiratory medications, new advancements in procedures and treatments. Provide informative websites and "800" numbers of self-education.   Pulmonary Rehab from 10/02/2016 in [facility] Cardiac and Pulmonary Rehab  Date  10/02/16  Educator  LB  Instruction Review Code  2- meets goals/outcomes      Lung Procedures: - Group verbal and written instruction to describe testing methods done to diagnose lung disease. Review the outcome of test results. Describe the treatment choices: Pulmonary Function Tests, ABGs and oximetry.   Energy Conservation: - Provide group verbal and written instruction for methods to conserve energy, plan and organize activities. Instruct on pacing techniques, use of adaptive equipment and posture/positioning to relieve shortness of breath.   Triggers: - Group verbal and written instruction to review types of environmental controls: home humidity, furnaces, filters, dust mite/pet prevention, HEPA vacuums. To discuss weather changes, air quality and the benefits of nasal washing.   Exacerbations: - Group verbal and written instruction to provide: warning signs, infection symptoms, calling MD promptly, preventive modes, and value of vaccinations. Review: effective airway clearance, coughing and/or vibration techniques. Create an Sports administrator.   Oxygen: - Individual and group verbal and written instruction on oxygen therapy. Includes supplement oxygen, available portable oxygen systems, continuous and intermittent flow rates, oxygen safety, concentrators, and Medicare reimbursement for oxygen.   Pulmonary Rehab from 10/02/2016 in [facility] Cardiac and Pulmonary Rehab  Date  08/20/16  Educator  LB  Instruction Review Code  2- meets goals/outcomes      Respiratory Medications: - Group verbal and written instruction to review  medications for lung disease. Drug class, frequency, complications, importance of spacers, rinsing mouth after steroid MDI's, and proper cleaning methods for nebulizers.   AED/CPR: - Group verbal and written instruction with the use of models to demonstrate the basic use of the AED with the basic ABC's of resuscitation.   Breathing Retraining: - Provides individuals verbal and written instruction on purpose, frequency, and proper technique of diaphragmatic breathing and pursed-lipped breathing. Applies individual practice skills.   Pulmonary Rehab from 10/02/2016 in [facility] Cardiac and Pulmonary Rehab  Date  08/26/16  Educator  AS  Instruction Review Code  2- meets goals/outcomes      Anatomy and Physiology  of the Lungs: - Group verbal and written instruction with the use of models to provide basic lung anatomy and physiology related to function, structure and complications of lung disease.   Pulmonary Rehab from 10/02/2016 in [facility] Cardiac and Pulmonary Rehab  Date  09/13/16  Educator  LB  Instruction Review Code  2- meets goals/outcomes      Heart Failure: - Group verbal and written instruction on the basics of heart failure: signs/symptoms, treatments, explanation of ejection fraction, enlarged heart and cardiomyopathy.   Sleep Apnea: - Individual verbal and written instruction to review Obstructive Sleep Apnea. Review of risk factors, methods for diagnosing and types of masks and machines for OSA.   Anxiety: - Provides group, verbal and written instruction on the correlation between heart/lung disease and anxiety, treatment options, and management of anxiety.   Relaxation: - Provides group, verbal and written instruction about the benefits of relaxation for patients with heart/lung disease. Also provides patients with examples of relaxation techniques.   Knowledge Questionnaire Score:     Knowledge Questionnaire Score - 08/20/16 1143      Knowledge Questionnaire Score    Pre Score 9/10       Core Components/Risk Factors/Patient Goals at Admission:     Personal Goals and Risk Factors at Admission - 08/20/16 1146      Core Components/Risk Factors/Patient Goals on Admission    Weight Management Yes   Intervention Weight Management: Develop a combined nutrition and exercise program designed to reach desired caloric intake, while maintaining appropriate intake of nutrient and fiber, sodium and fats, and appropriate energy expenditure required for the weight goal.;Weight Management: Provide education and appropriate resources to help participant work on and attain dietary goals.   Admit Weight 186 lb (84.4 kg)   Goal Weight: Short Term 180 lb (81.6 kg)   Goal Weight: Long Term 163 lb (73.9 kg)   Expected Outcomes Short Term: Continue to assess and modify interventions until short term weight is achieved;Long Term: Adherence to nutrition and physical activity/exercise program aimed toward attainment of established weight goal;Weight Loss: Understanding of general recommendations for a balanced deficit meal plan, which promotes 1-2 lb weight loss per week and includes a negative energy balance of [phone] kcal/d;Weight Maintenance: Understanding of the daily nutrition guidelines, which includes 25-35% calories from fat, 7% or less cal from saturated fats, less than 233m cholesterol, less than 1.5gm of sodium, & 5 or more servings of fruits and vegetables daily;Understanding recommendations for meals to include 15-35% energy as protein, 25-35% energy from fat, 35-60% energy from carbohydrates, less than 2012mof dietary cholesterol, 20-35 gm of total fiber daily;Understanding of distribution of calorie intake throughout the day with the consumption of 4-5 meals/snacks   Improve shortness of breath with ADL's Yes   Intervention Provide education, individualized exercise plan and daily activity instruction to help decrease symptoms of SOB with activities of daily living.    Expected Outcomes Short Term: Achieves a reduction of symptoms when performing activities of daily living.   Develop more efficient breathing techniques such as purse lipped breathing and diaphragmatic breathing; and practicing self-pacing with activity Yes   Intervention Provide education, demonstration and support about specific breathing techniuqes utilized for more efficient breathing. Include techniques such as pursed lipped breathing, diaphragmatic breathing and self-pacing activity.   Expected Outcomes Short Term: Participant will be able to demonstrate and use breathing techniques as needed throughout daily activities.   Increase knowledge of respiratory medications and ability to use respiratory devices properly  Yes  6-10 liters/min   Intervention Provide education and demonstration as needed of appropriate use of medications, inhalers, and oxygen therapy.   Expected Outcomes Short Term: Achieves understanding of medications use. Understands that oxygen is a medication prescribed by physician. Demonstrates appropriate use of inhaler and oxygen therapy.   Heart Failure Yes   Intervention Provide a combined exercise and nutrition program that is supplemented with education, support and counseling about heart failure. Directed toward relieving symptoms such as shortness of breath, decreased exercise tolerance, and extremity edema.   Expected Outcomes Improve functional capacity of life;Short term: Attendance in program 2-3 days a week with increased exercise capacity. Reported lower sodium intake. Reported increased fruit and vegetable intake. Reports medication compliance.;Short term: Daily weights obtained and reported for increase. Utilizing diuretic protocols set by physician.;Long term: Adoption of self-care skills and reduction of barriers for early signs and symptoms recognition and intervention leading to self-care maintenance.      Core Components/Risk Factors/Patient Goals Review:       Goals and Risk Factor Review    Row Name 08/26/16 1128 09/17/16 7062 10/15/16 3762         Core Components/Risk Factors/Patient Goals Review   Personal Goals Review Develop more efficient breathing techniques such as purse lipped breathing and diaphragmatic breathing and practicing self-pacing with activity. Weight Management/Obesity;Improve shortness of breath with ADL's;Increase knowledge of respiratory medications and ability to use respiratory devices properly.;Develop more efficient breathing techniques such as purse lipped breathing and diaphragmatic breathing and practicing self-pacing with activity. Weight Management/Obesity;Improve shortness of breath with ADL's;Develop more efficient breathing techniques such as purse lipped breathing and diaphragmatic breathing and practicing self-pacing with activity.     Review Pursed lip breathing was discussed and demonstrated with the patient. The patient showed understanding of how and when to use this breathing technique to help with shortness of breath and oxygen saturations.  Ms Cassel is making progress with her exercise goals. She self-adjusts her oxygen from 6-8l/m on the T4 anad XR, and then increases her oxygen to 10l/m with the treadmill.  Her weight management is through the transplant dietitian at [facility]. Fortunately her latest echo showed improvement to her EF - from 35% to 40-45%; the transplant goal is at least 50%. Ms Lasky has good PLB technique and uses pacing as needed. She attends regularly and enjoys the education. Ms Tenorio is struggling with shortness of breath and tiredness. Her pulmonologist is contacting the cardiologist to consult on lisinopril and toprol as the cause of her pulmonary problems. She is now coming to class in a wheel chair and is using a nonrebreather with exercise. Depending on how she feel, Ms Grauberger may only work on one exercise goal for an extended time. She is still planning her lung transplant as long as her EF  improves. She has good technique with her PLB and pacing. Ms Stobaugh has lost 4 lbs since starting the program.     Expected Outcomes Ms. Brucks will use pursed lip breathing during exercise and ADL to help control SOB and maintain acceptable oxygen saturations.  Continue improving her exercise progression and continue her cardiac medicine for improved EF. Continue exercing with modifications to prepare for her lung transplant.        Core Components/Risk Factors/Patient Goals at Discharge (Final Review):      Goals and Risk Factor Review - 10/15/16 8315      Core Components/Risk Factors/Patient Goals Review   Personal Goals Review Weight Management/Obesity;Improve shortness of breath  with ADL's;Develop more efficient breathing techniques such as purse lipped breathing and diaphragmatic breathing and practicing self-pacing with activity.   Review Ms Tancredi is struggling with shortness of breath and tiredness. Her pulmonologist is contacting the cardiologist to consult on lisinopril and toprol as the cause of her pulmonary problems. She is now coming to class in a wheel chair and is using a nonrebreather with exercise. Depending on how she feel, Ms Kimberlin may only work on one exercise goal for an extended time. She is still planning her lung transplant as long as her EF improves. She has good technique with her PLB and pacing. Ms Sundberg has lost 4 lbs since starting the program.   Expected Outcomes Continue exercing with modifications to prepare for her lung transplant.      ITP Comments:     ITP Comments    Row Name 09/04/16 1528 10/09/16 1607         ITP Comments Ms Murrillo called out Monday and Wednesday from [place]. She has a cold, but plans to come back Friday Called Ms Carte to check on her. She has had low O2Sat with exercise. During an appointment with Dr Farley Ly, Ms Defelice was cleared to wear a nonrebreather mask with exercise or use 10-15 l/m [place]. Dr Farley Ly is checking with her cardiologist about her  medication Toprol which could be contributing to her shortness of breath.          Comments: It is with much sadness - Ms Demilio died at home on 11/16/2016.

## 2016-10-22 DEATH — deceased

## 2016-12-03 NOTE — Telephone Encounter (Signed)
erroneus encounter - disregard
